# Patient Record
Sex: Female | Born: 1985 | Race: Black or African American | Hispanic: No | Smoking: Never smoker
Health system: Southern US, Community
[De-identification: ages and names within clinical notes are randomized; demographics above are authoritative.]

## PROBLEM LIST (undated history)

## (undated) DIAGNOSIS — Z975 Presence of (intrauterine) contraceptive device: Secondary | ICD-10-CM

## (undated) DIAGNOSIS — G43909 Migraine, unspecified, not intractable, without status migrainosus: Secondary | ICD-10-CM

## (undated) HISTORY — PX: RHINOPLASTY: SUR1284

## (undated) HISTORY — PX: BROW LIFT AND BLEPHAROPLASTY: SHX1271

## (undated) HISTORY — DX: Presence of (intrauterine) contraceptive device: Z97.5

## (undated) HISTORY — PX: ROOT CANAL: SHX2363

---

## 2010-10-22 ENCOUNTER — Inpatient Hospital Stay (HOSPITAL_COMMUNITY)
Admission: AD | Admit: 2010-10-22 | Discharge: 2010-10-24 | Payer: Self-pay | Source: Home / Self Care | Attending: Obstetrics and Gynecology | Admitting: Obstetrics and Gynecology

## 2010-10-22 LAB — RPR: RPR Ser Ql: NONREACTIVE

## 2010-10-22 LAB — CBC
HCT: 37 % (ref 36.0–46.0)
Hemoglobin: 12.4 g/dL (ref 12.0–15.0)
MCH: 28.9 pg (ref 26.0–34.0)
MCHC: 33.5 g/dL (ref 30.0–36.0)
MCV: 86.2 fL (ref 78.0–100.0)
Platelets: 119 10*3/uL — ABNORMAL LOW (ref 150–400)
RBC: 4.29 MIL/uL (ref 3.87–5.11)
RDW: 13.7 % (ref 11.5–15.5)
WBC: 7.6 10*3/uL (ref 4.0–10.5)

## 2010-10-23 LAB — CBC
HCT: 31 % — ABNORMAL LOW (ref 36.0–46.0)
Hemoglobin: 10.3 g/dL — ABNORMAL LOW (ref 12.0–15.0)
MCH: 28.9 pg (ref 26.0–34.0)
MCHC: 33.2 g/dL (ref 30.0–36.0)
MCV: 86.8 fL (ref 78.0–100.0)
Platelets: 112 10*3/uL — ABNORMAL LOW (ref 150–400)
RBC: 3.57 MIL/uL — ABNORMAL LOW (ref 3.87–5.11)
RDW: 13.9 % (ref 11.5–15.5)
WBC: 9.4 10*3/uL (ref 4.0–10.5)

## 2012-11-21 ENCOUNTER — Ambulatory Visit: Payer: BC Managed Care – PPO | Admitting: Obstetrics and Gynecology

## 2012-11-21 ENCOUNTER — Encounter: Payer: Self-pay | Admitting: Obstetrics and Gynecology

## 2012-11-21 VITALS — BP 108/68 | Temp 98.1°F | Ht 66.75 in | Wt 169.0 lb

## 2012-11-21 DIAGNOSIS — Z124 Encounter for screening for malignant neoplasm of cervix: Secondary | ICD-10-CM

## 2012-11-21 DIAGNOSIS — N898 Other specified noninflammatory disorders of vagina: Secondary | ICD-10-CM

## 2012-11-21 DIAGNOSIS — Z01419 Encounter for gynecological examination (general) (routine) without abnormal findings: Secondary | ICD-10-CM

## 2012-11-21 LAB — POCT WET PREP (WET MOUNT)
Whiff Test: NEGATIVE
pH: 5

## 2012-11-21 MED ORDER — FLUCONAZOLE 150 MG PO TABS: 150.0000 mg | ORAL_TABLET | Freq: Once | ORAL | Status: DC

## 2012-11-21 NOTE — Progress Notes (Signed)
Regular Periods: no iud Mammogram: no  Monthly Breast Ex.: yes Exercise: yes  Tetanus < 10 years: yes Seatbelts: yes  NI. Bladder Functn.: yes Abuse at home: no  Daily BM's: yes Stressful Work: no  Healthy Diet: yes Sigmoid-Colonoscopy: no  Calcium: no Medical problems this year: vaginal discharge   LAST PAP:2012  Contraception: iud mirena  Mammogram:  no  PCP: NO  PMH:  NO CHANGE  FMH: NO CHANGE  Last Bone Scan: NO  PT IS SINGLE

## 2012-11-21 NOTE — Progress Notes (Signed)
Subjective:    Courtney Arellano is a 27 y.o. female, G1P0, who presents for an annual exam. The patient reports malodorous vaginal discharge that is heavy.  Menstrual cycle:   LMP: No LMP recorded. Patient is not currently having periods (Reason: IUD).             Review of Systems Pertinent items are noted in HPI. Denies pelvic pain, urinary tract symptoms, vaginitis symptoms, irregular bleeding, menopausal symptoms, change in bowel habits or rectal bleeding   Objective:    BP 108/68  Temp 98.1 F (36.7 C) (Oral)  Ht 5' 6.75" (1.695 m)  Wt 169 lb (76.658 kg)  BMI 26.67 kg/m2    Wt Readings from Last 1 Encounters:  11/21/12 169 lb (76.658 kg)   Body mass index is 26.67 kg/(m^2). General Appearance: Alert, no acute distress HEENT: Grossly normal Neck / Thyroid: Supple, no thyromegaly or cervical adenopathy Lungs: Clear to auscultation bilaterally Back: No CVA tenderness Breast Exam: No masses or nodes.No dimpling, nipple retraction or discharge. Cardiovascular: Regular rate and rhythm.  Gastrointestinal: Soft, non-tender, no masses or organomegaly Pelvic Exam: EGBUS-wnl, vagina-normal rugae, moderate clumpy yellow tinged discharge, cervix- without lesions or tenderness, uterus appears normal size shape and consistency, adnexae-no masses or tenderness Lymphatic Exam: Non-palpable nodes in neck, clavicular,  axillary, or inguinal regions  Skin: no rashes or abnormalities Extremities: no clubbing cyanosis or edema  Neurologic: grossly normal Psychiatric: Alert and oriented  Wet Prep: pH-5.0,  whiff-negative, few  hyphae    Assessment:   Routine GYN Exam Yeast Infection   Plan:  Perineal hygiene  PAP sent  Diflucan 150 mg #1 1 po stat 1 refill  RTO 1 year or prn  Courtney Arellano,ELMIRAPA-C

## 2012-11-22 LAB — PAP IG W/ RFLX HPV ASCU

## 2013-12-12 ENCOUNTER — Emergency Department (HOSPITAL_BASED_OUTPATIENT_CLINIC_OR_DEPARTMENT_OTHER)
Admission: EM | Admit: 2013-12-12 | Discharge: 2013-12-13 | Disposition: A | Discharge status: Home / Self Care | Payer: BC Managed Care – PPO | Attending: Emergency Medicine | Admitting: Emergency Medicine

## 2013-12-12 ENCOUNTER — Ambulatory Visit (INDEPENDENT_AMBULATORY_CARE_PROVIDER_SITE_OTHER): Payer: BC Managed Care – PPO | Admitting: Family Medicine

## 2013-12-12 ENCOUNTER — Encounter (HOSPITAL_BASED_OUTPATIENT_CLINIC_OR_DEPARTMENT_OTHER): Payer: Self-pay | Admitting: Emergency Medicine

## 2013-12-12 VITALS — BP 102/78 | HR 78 | Temp 98.2°F | Resp 16 | Ht 67.5 in | Wt 153.0 lb

## 2013-12-12 DIAGNOSIS — R51 Headache: Secondary | ICD-10-CM

## 2013-12-12 DIAGNOSIS — E860 Dehydration: Secondary | ICD-10-CM

## 2013-12-12 DIAGNOSIS — Z975 Presence of (intrauterine) contraceptive device: Secondary | ICD-10-CM | POA: Insufficient documentation

## 2013-12-12 DIAGNOSIS — G43909 Migraine, unspecified, not intractable, without status migrainosus: Secondary | ICD-10-CM | POA: Insufficient documentation

## 2013-12-12 DIAGNOSIS — R519 Headache, unspecified: Secondary | ICD-10-CM

## 2013-12-12 DIAGNOSIS — Z3202 Encounter for pregnancy test, result negative: Secondary | ICD-10-CM | POA: Insufficient documentation

## 2013-12-12 DIAGNOSIS — R110 Nausea: Secondary | ICD-10-CM

## 2013-12-12 HISTORY — DX: Migraine, unspecified, not intractable, without status migrainosus: G43.909

## 2013-12-12 LAB — PREGNANCY, URINE: Preg Test, Ur: NEGATIVE

## 2013-12-12 MED ORDER — SODIUM CHLORIDE 0.9 % IV SOLN
1000.0000 mL | Freq: Once | INTRAVENOUS | Status: AC
  Administered 2013-12-12: 1000 mL via INTRAVENOUS

## 2013-12-12 MED ORDER — DIPHENHYDRAMINE HCL 50 MG/ML IJ SOLN
25.0000 mg | Freq: Once | INTRAMUSCULAR | Status: AC
  Administered 2013-12-12: 25 mg via INTRAVENOUS
  Filled 2013-12-12: qty 1

## 2013-12-12 MED ORDER — METOCLOPRAMIDE HCL 5 MG/ML IJ SOLN
10.0000 mg | Freq: Once | INTRAMUSCULAR | Status: AC
  Administered 2013-12-12: 10 mg via INTRAVENOUS
  Filled 2013-12-12: qty 2

## 2013-12-12 MED ORDER — SODIUM CHLORIDE 0.9 % IV SOLN
1000.0000 mL | INTRAVENOUS | Status: DC
  Administered 2013-12-12: 1000 mL via INTRAVENOUS

## 2013-12-12 NOTE — ED Notes (Signed)
C/o HA, lightheaded,nausea since 3pm-feels congestion to right side of face

## 2013-12-12 NOTE — Patient Instructions (Signed)
Driving directions to [facility] 3D2D  [phone]  - more info    [address]  [place], [place] [address]     1. Head south on [address] toward [address]      0.5 mi    2. Sharp left onto [address]      0.6 mi    3. Turn left onto the [address] ramp      0.2 mi    4. Merge onto [address]      3.0 mi    5. Continue straight to stay on [address]      0.4 mi    6. Slight left to stay on [address]      1.2 mi    7. Turn right onto [address]      0.1 mi    8. Turn left onto [address]      361 ft    9. Take the 1st left onto [address]  Destination will be on the right    Driving directions to [facility] 3D2D  [phone]  - more info    [address]  [place], [place] [address]     1. Head north on [address] toward [address]      344 ft    2. Turn right onto [address]      0.3 mi    3. Slight left to stay on [address]      1.7 mi    4. Turn left onto [address]  Destination will be on the right     0.6 mi     [facility]  [address]   Driving directions to [address], [place], [place] [address] 3D2D  - more info    [address]  [place], [place] [address]     1. Head south on [address] toward [address]      0.5 mi    2. Sharp left onto [address]      0.6 mi    3. Turn left onto the [address] ramp      0.2 mi    4. Merge onto [address]      3.0 mi    5. Continue straight to stay on [address]      0.4 mi    6. Slight left to stay on [address]  Destination will be on the right     1.0 mi     [address]  [place], [place] [address]   Driving directions to [facility] 3D2D  [phone]  - more info    [address]  [place],  [address]     1. Head south on [address] toward [address]      0.5 mi    2. Sharp left onto [address]      0.6 mi    3. Turn left onto the [address] ramp      0.2 mi    4. Merge onto  [address]      3.0 mi    5. Slight right toward [address] (signs for [address])      0.2 mi    6. Take the ramp to [address]      338 ft    7. Turn left onto [address]      0.3 mi    8. Turn left onto [address]  Destination will be on the right     0.2 mi     [facility]  [address]   Driving directions to [facility] 3D2D  - more info    [address]  [place], [place] [address]     1. Head south on [address] toward [address]      0.7 mi    2. Turn left onto [address]      0.4 mi    3. Take the 3rd right onto [address]      1.1 mi    4. Take the [address] ramp to [place]      0.2 mi    5. Merge onto [address]      3.7 mi    6. Take [address] toward [place]      0.3 mi    7. Keep left at the fork, follow signs for [address]      381 ft    8. Keep left at the fork, follow signs for [address]      302 ft    9. Turn left onto [address]      2.6 mi    10. Turn right onto [address]  Destination will be on the left     0.2 mi     [facility]    

## 2013-12-12 NOTE — Progress Notes (Signed)
 Chief Complaint:  Chief Complaint  Patient presents with  . Migraine    worse one ever-came on suddenly this afternoon-nausea, palpitations, dizziness    HPI: Courtney Arellano is a 28 y.o. female who is here for the worse HA of her life, she has pain behind her right eye, she is dizzy. She has had nausea, pain behind her right eye, no emesis, she has had a runny right nostril, feels right nostril is congested.Denies facial pain like URI sxs or sinus sxs.  She has had no blurred visio, however she,has had postural dizziness and racing heart beat. Feels she has a fast heart rate when she stands or sits up. This HA  is 7/10 pain, she is worried there is something that is wrong and it is not just a headache since it feels so different than normal. Deneis numbness or wekaness or tingling or CP. She has not been able to eat or drink, has slept since 5 pm but HA is still present.  She has noise and light sensitivity  She has had a hsitory of occipital HA not ever treated with meds, she has had chiropractic work done. This has helped she used to get them weekly but was now getting treatments monthly then even further out. No prior efficacy with meds.Has not tried anything for this because she doesnot know what to try.   Past Medical History  Diagnosis Date  . IUD (intrauterine device) in place    No past surgical history on file. History   Social History  . Marital Status: Single    Spouse Name: N/A    Number of Children: N/A  . Years of Education: N/A   Social History Main Topics  . Smoking status: Never Smoker   . Smokeless tobacco: Never Used  . Alcohol Use: Yes     Comment: OCCASIONAL  . Drug Use: No  . Sexual Activity: Yes    Birth Control/ Protection: IUD     Comment: mirena   Other Topics Concern  . None   Social History Narrative  . None   Family History  Problem Relation Age of Onset  . Kidney failure Paternal Grandmother   . Hypertension Father   .  Diabetes Mother    No Known Allergies Prior to Admission medications   Medication Sig Start Date End Date Taking? Authorizing Provider  levonorgestrel (MIRENA) 20 MCG/24HR IUD 1 each by Intrauterine route once.    Historical Provider, MD     ROS: The patient denies fevers, chills, night sweats, unintentional weight loss, chest pain,  wheezing, dyspnea on exertion, vomiting, abdominal pain, dysuria, hematuria, melena, numbness, weakness, or tingling.   All other systems have been reviewed and were otherwise negative with the exception of those mentioned in the HPI and as above.    PHYSICAL EXAM: Filed Vitals:   12/12/13 2035  BP: 102/78  Pulse: 78  Temp: 98.2 F (36.8 C)  Resp: 16   Filed Vitals:   12/12/13 2035  Height: 5' 7.5" (1.715 m)  Weight: 153 lb (69.4 kg)   Body mass index is 23.6 kg/(m^2).  General: Alert, mild distress HEENT:  Normocephalic, atraumatic, oropharynx patent. EOMI, PERRLA, fundoscopic exam nl Cardiovascular:  Regular rate and rhythm, no rubs murmurs or gallops.  No Carotid bruits, radial pulse intact. No pedal edema.  Respiratory: Clear to auscultation bilaterally.  No wheezes, rales, or rhonchi.  No cyanosis, no use of accessory musculature GI: No organomegaly, abdomen is soft and  non-tender, positive bowel sounds.  No masses. Skin: No rashes. Neurologic: Facial musculature symmetric. CN 2-12 grossly normal, cerebellar exam nl Psychiatric: Patient is appropriate throughout our interaction. Lymphatic: No cervical lymphadenopathy Musculoskeletal: Gait intact.   LABS: Results for orders placed in visit on 11/21/12  POCT WET PREP (WET [place])      Result Value Ref Range   Source Wet Prep POC       WBC, Wet Prep HPF POC       Bacteria Wet Prep HPF POC       BACTERIA WET PREP MORPHOLOGY POC       Clue Cells Wet Prep HPF POC       CLUE CELLS WET PREP WHIFF POC       Yeast Wet Prep HPF POC Few     KOH Wet Prep POC       Trichomonas Wet Prep HPF POC        pH 5.0     Whiff Test negative    PAP IG W/ RFLX HPV ASCU      Result Value Ref Range   Specimen adequacy:       FINAL DIAGNOSIS:       COMMENTS:       Cytotechnologist:         EKG/XRAY:   Primary read interpreted [facility] Con[facility]   ASSESSMENT/PLAN: Encounter Diagnoses  Name Primary?  . Headache   . Worst headache of life Yes  . Nausea alone   . Dehydration     Send to ER for further evaluation since the worse HA of her life, feels different then others she has had, she has tachycardia standing up 120-140s and also BP is slightly hypotensive for her Patient is concerned because HAs are worse and wants to know if she has something more than just HA She needs hydration and IV or IM pain meds, we are limited currently due to inclement weather, additionally we only have nubain and no other pain meds After d/w patient pros and cons of what we can and cannot do here in urgent care and what is available at ER, she has decided to go to ER. I have called [facility] and was informed the diff ER options and wait time.  Gross sideeffects, risk and benefits, and alternatives of medications d/w patient. Patient is aware that all medications have potential sideeffects and we are unable to predict every sideeffect or drug-drug interaction that may occur.  Hamilton CapriLE,  PHUONG, DO 12/12/2013 9:12 PM

## 2013-12-12 NOTE — ED Provider Notes (Signed)
CSN: [id number]     Arrival date & time 12/12/13  2130 History   First MD Initiated Contact with Patient 12/12/13 2302     Chief Complaint  Patient presents with  . Headache     (Consider location/radiation/quality/duration/timing/severity/associated sxs/prior Treatment) Patient is a 28 y.o. female presenting with headaches. The history is provided by the patient.  Headache She had onset about 3 PM of a right-sided headache. Headache is burning in nature and she rates the pain at 7/10. It is worse with exposure to light and is worse with movement. There is associated nausea but no vomiting. She denies any visual change. There is no phonophobia. Headache is somewhat similar to migraines she has had in the past. She has not taken any medication to try and help the headache. Nothing seems to make it feel better. She went to urgent care and was referred here. She denies fever or chills. Denies weakness, numbness, tingling.   Past Medical History  Diagnosis Date  . IUD (intrauterine device) in place   . Migraine    History reviewed. No pertinent past surgical history. Family History  Problem Relation Age of Onset  . Kidney failure Paternal Grandmother   . Hypertension Father   . Diabetes Mother    History  Substance Use Topics  . Smoking status: Never Smoker   . Smokeless tobacco: Never Used  . Alcohol Use: No   OB History   Grav Para Term Preterm Abortions TAB SAB Ect Mult Living   1         1     Review of Systems  Neurological: Positive for headaches.  All other systems reviewed and are negative.      Allergies  Review of patient's allergies indicates no known allergies.  Home Medications   Current Outpatient Rx  Name  Route  Sig  Dispense  Refill  . levonorgestrel (MIRENA) 20 MCG/24HR IUD   Intrauterine   1 each by Intrauterine route once.          BP 117/70  Pulse 85  Temp(Src) 98.7 F (37.1 C) (Oral)  Resp 16  Ht 5\' 5"  (1.651 m)  Wt 153 lb (69.4 kg)   BMI 25.46 kg/m2  SpO2 98%  LMP 12/04/2013 Physical Exam  Nursing note and vitals reviewed.  28 year old female, resting comfortably and in no acute distress. Vital signs are  normal . Oxygen saturation is 98%, which is normal. Head is normocephalic and atraumatic. PERRLA, EOMI. Oropharynx is clear. fundi show no hemorrhage, exudate, or papilledema.  Neck is nontender and supple without adenopathy or JVD. Back is nontender and there is no CVA tenderness. Lungs are clear without rales, wheezes, or rhonchi. Chest is nontender. Heart has regular rate and rhythm without murmur. Abdomen is soft, flat, nontender without masses or hepatosplenomegaly and peristalsis is normoactive. Extremities have no cyanosis or edema, full range of motion is present. Skin is warm and dry without rash. Neurologic: Mental status is normal, cranial nerves are intact, there are no motor or sensory deficits.  ED Course  Procedures (including critical care time)  MDM   Final diagnoses:  Migraine headache    Headache which seems most like a migraine headache. She will be given a migraine cocktail and reassessed. Old records are reviewed and only relevant visit is the urgent care visit and just prior to coming here.   She feels much better after IV fluids, IV metoclopramide, and IV diphenhydramine. She is discharged with a prescription  for metoclopramide.  Dione Boozeavid Viola Placeres, MD 12/13/13 [id number]

## 2013-12-13 MED ORDER — METOCLOPRAMIDE HCL 10 MG PO TABS: 10.0000 mg | ORAL_TABLET | Freq: Four times a day (QID) | ORAL | Status: DC | PRN

## 2013-12-13 NOTE — Discharge Instructions (Signed)
Migraine Headache °A migraine headache is an intense, throbbing pain on one or both sides of your head. A migraine can last for 30 minutes to several hours. °CAUSES  °The exact cause of a migraine headache is not always known. However, a migraine may be caused when nerves in the brain become irritated and release chemicals that cause inflammation. This causes pain. °Certain things may also trigger migraines, such as: °· Alcohol. °· Smoking. °· Stress. °· Menstruation. °· Aged cheeses. °· Foods or drinks that contain nitrates, glutamate, aspartame, or tyramine. °· Lack of sleep. °· Chocolate. °· Caffeine. °· Hunger. °· Physical exertion. °· Fatigue. °· Medicines used to treat chest pain (nitroglycerine), birth control pills, estrogen, and some blood pressure medicines. °SIGNS AND SYMPTOMS °· Pain on one or both sides of your head. °· Pulsating or throbbing pain. °· Severe pain that prevents daily activities. °· Pain that is aggravated by any physical activity. °· Nausea, vomiting, or both. °· Dizziness. °· Pain with exposure to bright lights, loud noises, or activity. °· General sensitivity to bright lights, loud noises, or smells. °Before you get a migraine, you may get warning signs that a migraine is coming (aura). An aura may include: °· Seeing flashing lights. °· Seeing bright spots, halos, or zig-zag lines. °· Having tunnel vision or blurred vision. °· Having feelings of numbness or tingling. °· Having trouble talking. °· Having muscle weakness. °DIAGNOSIS  °A migraine headache is often diagnosed based on: °· Symptoms. °· Physical exam. °· A CT scan or MRI of your head. These imaging tests cannot diagnose migraines, but they can help rule out other causes of headaches. °TREATMENT °Medicines may be given for pain and nausea. Medicines can also be given to help prevent recurrent migraines.  °HOME CARE INSTRUCTIONS °· Only take over-the-counter or prescription medicines for pain or discomfort as directed by your  health care provider. The use of long-term narcotics is not recommended. °· Lie down in a dark, quiet room when you have a migraine. °· Keep a journal to find out what may trigger your migraine headaches. For example, write down: °· What you eat and drink. °· How much sleep you get. °· Any change to your diet or medicines. °· Limit alcohol consumption. °· Quit smoking if you smoke. °· Get 7 9 hours of sleep, or as recommended by your health care provider. °· Limit stress. °· Keep lights dim if bright lights bother you and make your migraines worse. °SEEK IMMEDIATE MEDICAL CARE IF:  °· Your migraine becomes severe. °· You have a fever. °· You have a stiff neck. °· You have vision loss. °· You have muscular weakness or loss of muscle control. °· You start losing your balance or have trouble walking. °· You feel faint or pass out. °· You have severe symptoms that are different from your first symptoms. °MAKE SURE YOU:  °· Understand these instructions. °· Will watch your condition. °· Will get help right away if you are not doing well or get worse. °Document Released: 10/04/2005 Document Revised: 07/25/2013 Document Reviewed: 06/11/2013 °ExitCare® Patient Information ©2014 [employer]. ° °Metoclopramide tablets °What is this medicine? °METOCLOPRAMIDE (met oh kloe PRA mide) is used to treat the symptoms of gastroesophageal reflux disease (GERD) like heartburn. It is also used to treat people with slow emptying of the stomach and intestinal tract. °This medicine may be used for other purposes; ask your health care provider or pharmacist if you have questions. °COMMON BRAND NAME(S): Reglan °What should I   tell my health care provider before I take this medicine? °They need to know if you have any of these conditions: °-breast cancer °-depression °-diabetes °-heart failure °-high blood pressure °-kidney disease °-liver disease °-Parkinson's disease or a movement disorder °-pheochromocytoma °-seizures °-stomach  obstruction, bleeding, or perforation °-an unusual or allergic reaction to metoclopramide, procainamide, sulfites, other medicines, foods, dyes, or preservatives °-pregnant or trying to get pregnant °-breast-feeding °How should I use this medicine? °Take this medicine by mouth with a glass of water. Follow the directions on the prescription label. Take this medicine on an empty stomach, about 30 minutes before eating. Take your doses at regular intervals. Do not take your medicine more often than directed. Do not stop taking except on the advice of your doctor or health care professional. °A special MedGuide will be given to you by the pharmacist with each prescription and refill. Be sure to read this information carefully each time. °Talk to your pediatrician regarding the use of this medicine in children. Special care may be needed. °Overdosage: If you think you have taken too much of this medicine contact a poison control center or emergency room at once. °NOTE: This medicine is only for you. Do not share this medicine with others. °What if I miss a dose? °If you miss a dose, take it as soon as you can. If it is almost time for your next dose, take only that dose. Do not take double or extra doses. °What may interact with this medicine? °-acetaminophen °-cyclosporine °-digoxin °-medicines for blood pressure °-medicines for diabetes, including insulin °-medicines for hay fever and other allergies °-medicines for depression, especially an Monoamine Oxidase Inhibitor (MAOI) °-medicines for Parkinson's disease, like levodopa °-medicines for sleep or for pain °-tetracycline °This list may not describe all possible interactions. Give your health care provider a list of all the medicines, herbs, non-prescription drugs, or dietary supplements you use. Also tell them if you smoke, drink alcohol, or use illegal drugs. Some items may interact with your medicine. °What should I watch for while using this medicine? °It may  take a few weeks for your stomach condition to start to get better. However, do not take this medicine for longer than 12 weeks. The longer you take this medicine, and the more you take it, the greater your chances are of developing serious side effects. If you are an elderly patient, a female patient, or you have diabetes, you may be at an increased risk for side effects from this medicine. Contact your doctor immediately if you start having movements you cannot control such as lip smacking, rapid movements of the tongue, involuntary or uncontrollable movements of the eyes, head, arms and legs, or muscle twitches and spasms. °Patients and their families should watch out for worsening depression or thoughts of suicide. Also watch out for any sudden or severe changes in feelings such as feeling anxious, agitated, panicky, irritable, hostile, aggressive, impulsive, severely restless, overly excited and hyperactive, or not being able to sleep. If this happens, especially at the beginning of treatment or after a change in dose, call your doctor. °Do not treat yourself for high fever. Ask your doctor or health care professional for advice. °You may get drowsy or dizzy. Do not drive, use machinery, or do anything that needs mental alertness until you know how this drug affects you. Do not stand or sit up quickly, especially if you are an older patient. This reduces the risk of dizzy or fainting spells. Alcohol can make you more   drowsy and dizzy. Avoid alcoholic drinks. What side effects may I notice from receiving this medicine? Side effects that you should report to your doctor or health care professional as soon as possible: -allergic reactions like skin rash, itching or hives, swelling of the face, lips, or tongue -abnormal production of milk in females -breast enlargement in both males and females -change in the way you walk -difficulty moving, speaking or swallowing -drooling, lip smacking, or rapid movements  of the tongue -excessive sweating -fever -involuntary or uncontrollable movements of the eyes, head, arms and legs -irregular heartbeat or palpitations -muscle twitches and spasms -unusually weak or tired Side effects that usually do not require medical attention (report to your doctor or health care professional if they continue or are bothersome): -change in sex drive or performance -depressed mood -diarrhea -difficulty sleeping -headache -menstrual changes -restless or nervous This list may not describe all possible side effects. Call your doctor for medical advice about side effects. You may report side effects to FDA at 1-800-FDA-1088. Where should I keep my medicine? Keep out of the reach of children. Store at room temperature between 20 and 25 degrees C (68 and 77 degrees F). Protect from light. Keep container tightly closed. Throw away any unused medicine after the expiration date. NOTE: This sheet is a summary. It may not cover all possible information. If you have questions about this medicine, talk to your doctor, pharmacist, or health care provider.  2014, [employer] Standard. (2012-02-01 13:04:38)

## 2013-12-13 NOTE — ED Notes (Signed)
MD at bedside. 

## 2014-08-19 ENCOUNTER — Encounter (HOSPITAL_BASED_OUTPATIENT_CLINIC_OR_DEPARTMENT_OTHER): Payer: Self-pay | Admitting: Emergency Medicine

## 2014-08-27 DIAGNOSIS — Z975 Presence of (intrauterine) contraceptive device: Secondary | ICD-10-CM | POA: Insufficient documentation

## 2014-11-12 ENCOUNTER — Ambulatory Visit (INDEPENDENT_AMBULATORY_CARE_PROVIDER_SITE_OTHER): Payer: BLUE CROSS/BLUE SHIELD

## 2014-11-12 ENCOUNTER — Ambulatory Visit (INDEPENDENT_AMBULATORY_CARE_PROVIDER_SITE_OTHER): Payer: BLUE CROSS/BLUE SHIELD | Admitting: Internal Medicine

## 2014-11-12 VITALS — BP 106/76 | HR 79 | Temp 98.2°F | Resp 16 | Ht 67.5 in | Wt 156.0 lb

## 2014-11-12 DIAGNOSIS — R079 Chest pain, unspecified: Secondary | ICD-10-CM

## 2014-11-12 MED ORDER — MELOXICAM 15 MG PO TABS: 15.0000 mg | ORAL_TABLET | Freq: Every day | ORAL | Status: DC

## 2014-11-12 MED ORDER — HYDROCODONE-ACETAMINOPHEN 5-325 MG PO TABS: ORAL_TABLET | ORAL | Status: DC

## 2014-11-12 MED ORDER — CYCLOBENZAPRINE HCL 10 MG PO TABS: 10.0000 mg | ORAL_TABLET | Freq: Every day | ORAL | Status: DC

## 2014-11-12 NOTE — Progress Notes (Signed)
   Subjective:    Patient ID: Courtney CapersStephanie Saint-Phard, female    DOB: December 08, 1985, 29 y.o.   MRN: [id number]  HPI  Chief Complaint  Patient presents with  . Chest Pain    left side since saturday    she actually started with mild left sided chest pain on Friday, 4 days ago, on awakening. No known injury. No cough or upper respiratory infection. No fever. No shortness of breath. The pain has waxed and waned but became severe enough on Sunday to make it difficult for her to sleep. Also has trouble moving, getting in and out of the car, coughing, deep breathing. Very athletic with intensive cardio which she did the day before this pain started.   Survived one childbirth without evidence of clotting disorder No swelling of her extremities No family history of clotting disorders IUD  Review of Systems No fever chills or night sweats  No recent travel or exposures to illness  No palpitations or tachycardia is noted  No abdominal pain nausea vomiting dysuria or frequency     Objective:   Physical Exam BP 106/76 mmHg  Pulse 79  Temp(Src) 98.2 F (36.8 C) (Oral)  Resp 16  Ht 5' 7.5" (1.715 m)  Wt 156 lb (70.761 kg)  BMI 24.06 kg/m2  SpO2 100% HEENT clear Heart regular without murmur Lungs are clear to auscultation although deep breathing produces left-sided chest pain Abdomen benign She is tender along the left posterior rib margin into the axilla without swelling or defect   [facility] reading (PRIMARY) by  Dr. Merla Richesoolittle= chest x-rays clear       Assessment & Plan:  Problem #1 chest wall pain probably secondary to muscle injury  Meds ordered this encounter  Medications  . meloxicam (MOBIC) 15 MG tablet    Sig: Take 1 tablet (15 mg total) by mouth daily.    Dispense:  30 tablet    Refill:  0  . cyclobenzaprine (FLEXERIL) 10 MG tablet    Sig: Take 1 tablet (10 mg total) by mouth at bedtime.    Dispense:  10 tablet    Refill:  0  . HYDROcodone-acetaminophen (NORCO/VICODIN)  5-325 MG per tablet    Sig: 1-2 at hs if can't sleep due to pain    Dispense:  6 tablet    Refill:  0   Heat Stretch gently--adv to full activ F/u sat if not improving

## 2017-05-17 ENCOUNTER — Encounter (HOSPITAL_COMMUNITY): Payer: Self-pay | Admitting: Obstetrics and Gynecology

## 2017-05-17 DIAGNOSIS — G43909 Migraine, unspecified, not intractable, without status migrainosus: Secondary | ICD-10-CM | POA: Insufficient documentation

## 2019-05-17 ENCOUNTER — Encounter (HOSPITAL_COMMUNITY): Payer: Self-pay | Admitting: Emergency Medicine

## 2019-05-17 ENCOUNTER — Ambulatory Visit (HOSPITAL_COMMUNITY)
Admission: EM | Admit: 2019-05-17 | Discharge: 2019-05-17 | Disposition: A | Discharge status: Home / Self Care | Payer: 59 | Attending: Family Medicine | Admitting: Family Medicine

## 2019-05-17 ENCOUNTER — Other Ambulatory Visit: Payer: Self-pay

## 2019-05-17 DIAGNOSIS — Z202 Contact with and (suspected) exposure to infections with a predominantly sexual mode of transmission: Secondary | ICD-10-CM

## 2019-05-17 DIAGNOSIS — Z113 Encounter for screening for infections with a predominantly sexual mode of transmission: Secondary | ICD-10-CM | POA: Diagnosis present

## 2019-05-17 NOTE — ED Triage Notes (Signed)
Pt was sent a message from a former partner from 2 months ago that stated they were positive for an STD but did not specify what.  Pt states she has not had any symptoms.  She does have some d/c but she states she typically has a little of that and has about a once a year issue with BV.

## 2019-05-17 NOTE — ED Provider Notes (Signed)
[facility]    CSN: [id number] Arrival date & time: 05/17/19  1224     History   Chief Complaint Chief Complaint  Patient presents with  . Exposure to STD    HPI Zamya Culhane is a 33 y.o. female.   Patient is a 33 year old female that presents today for STD screening.  Reporting that she was sent a message from a former partner from a few months back that they were positive for an STD.  There is no specification on what STD it was.  She denies any current symptoms.  She does have soap vaginal discharge but nothing abnormal.  Denies any associated abdominal pain, back pain, fevers, dysuria, hematuria, urinary frequency.  No LMP recorded. (Menstrual status: IUD).  ROS per HPI      Past Medical History:  Diagnosis Date  . IUD (intrauterine device) in place   . Migraine     There are no active problems to display for this patient.   History reviewed. No pertinent surgical history.  OB History    Gravida  1   Para      Term      Preterm      AB      Living  1     SAB      TAB      Ectopic      Multiple      Live Births               Home Medications    Prior to Admission medications   Medication Sig Start Date End Date Taking? Authorizing Provider  levonorgestrel (MIRENA) 20 MCG/24HR IUD 1 each by Intrauterine route once.    [provider]  metoCLOPramide (REGLAN) 10 MG tablet Take 1 tablet (10 mg total) by mouth every 6 (six) hours as needed for nausea (or headache). Patient not taking: Reported on 11/12/2014 5/88/50 2/77/41  Delora Fuel, MD    Family History Family History  Problem Relation Age of Onset  . Kidney failure Paternal Grandmother   . Hypertension Father   . Diabetes Mother   . Hypertension Mother     Social History Social History   Tobacco Use  . Smoking status: Never Smoker  . Smokeless tobacco: Never Used  Substance Use Topics  . Alcohol use: Yes  . Drug use: No     Allergies    Patient has no known allergies.   Review of Systems Review of Systems   Physical Exam Triage Vital Signs ED Triage Vitals  Enc Vitals Group     BP 05/17/19 1248 104/74     Pulse Rate 05/17/19 1248 61     Resp 05/17/19 1248 12     Temp 05/17/19 1248 98.2 F (36.8 C)     Temp Source 05/17/19 1248 Oral     SpO2 05/17/19 1248 100 %     Weight --      Height --      Head Circumference --      Peak Flow --      Pain Score 05/17/19 1245 0     Pain Loc --      Pain Edu? --      Excl. in [place]? --    No data found.  Updated Vital Signs BP 104/74 (BP Location: Left Arm)   Pulse 61   Temp 98.2 F (36.8 C) (Oral)   Resp 12   SpO2 100%   Visual Acuity Right Eye Distance:  Left Eye Distance:   Bilateral Distance:    Right Eye Near:   Left Eye Near:    Bilateral Near:     Physical Exam Vitals signs and nursing note reviewed.  Constitutional:      General: She is not in acute distress.    Appearance: Normal appearance. She is not ill-appearing, toxic-appearing or diaphoretic.  HENT:     Head: Normocephalic.     Nose: Nose normal.     Mouth/Throat:     Pharynx: Oropharynx is clear.  Eyes:     Conjunctiva/sclera: Conjunctivae normal.  Neck:     Musculoskeletal: Normal range of motion.  Pulmonary:     Effort: Pulmonary effort is normal.  Abdominal:     Palpations: Abdomen is soft.     Tenderness: There is no abdominal tenderness.  Musculoskeletal: Normal range of motion.  Skin:    General: Skin is warm and dry.     Findings: No rash.  Neurological:     Mental Status: She is alert.  Psychiatric:        Mood and Affect: Mood normal.      UC Treatments / Results  Labs (all labs ordered are listed, but only abnormal results are displayed) Labs Reviewed  HIV ANTIBODY (ROUTINE TESTING W REFLEX)  RPR  CERVICOVAGINAL ANCILLARY ONLY    EKG   Radiology No results found.  Procedures Procedures (including critical care time)  Medications Ordered in UC  Medications - No data to display  Initial Impression / Assessment and Plan / UC Course  I have reviewed the triage vital signs and the nursing notes.  Pertinent labs & imaging results that were available during my care of the patient were reviewed by me and considered in my medical decision making (see chart for details).     Screening for STDs Labs pending and will call with any positive results.  Also recommending checking MyChart. Final Clinical Impressions(s) / UC Diagnoses   Final diagnoses:  Screen for STD (sexually transmitted disease)     Discharge Instructions     We are screening you for STDs. We will call you with any positive results or you can check your MyChart for results.    ED Prescriptions    None     Controlled Substance Prescriptions [place] Controlled Substance Registry consulted? Not Applicable   Janace ArisBast, Iley Deignan A, NP 05/17/19 1421

## 2019-05-17 NOTE — Discharge Instructions (Addendum)
We are screening you for STDs. We will call you with any positive results or you can check your MyChart for results.

## 2019-05-18 LAB — HIV ANTIBODY (ROUTINE TESTING W REFLEX): HIV Screen 4th Generation wRfx: NONREACTIVE

## 2019-05-18 LAB — RPR: RPR Ser Ql: NONREACTIVE

## 2019-05-19 LAB — CERVICOVAGINAL ANCILLARY ONLY
Bacterial vaginitis: NEGATIVE
Candida vaginitis: POSITIVE — AB
Chlamydia: NEGATIVE
Neisseria Gonorrhea: NEGATIVE
Trichomonas: NEGATIVE

## 2019-05-21 ENCOUNTER — Telehealth (HOSPITAL_COMMUNITY): Payer: Self-pay | Admitting: Emergency Medicine

## 2019-05-21 ENCOUNTER — Encounter (HOSPITAL_COMMUNITY): Payer: Self-pay

## 2019-05-21 MED ORDER — FLUCONAZOLE 150 MG PO TABS: 150.0000 mg | ORAL_TABLET | Freq: Once | ORAL | 0 refills | Status: AC

## 2019-05-21 NOTE — Telephone Encounter (Signed)
Test for candida (yeast) was positive.  Prescription for fluconazole 150mg  po now, repeat dose in 3d if needed, #2 no refills, sent to the pharmacy of record.  Recheck or followup with PCP for further evaluation if symptoms are not improving.    Attempted to reach patient. No answer at this time. Voicemail left.   Mychart message sent.

## 2019-05-21 NOTE — Telephone Encounter (Signed)
Pt called back and was given swab results, all questions answered.

## 2019-07-10 ENCOUNTER — Ambulatory Visit: Payer: 59 | Admitting: Registered Nurse

## 2019-07-10 ENCOUNTER — Encounter: Payer: Self-pay | Admitting: Registered Nurse

## 2019-07-10 ENCOUNTER — Other Ambulatory Visit: Payer: Self-pay

## 2019-07-10 VITALS — BP 101/62 | HR 60 | Temp 98.7°F | Resp 16 | Ht 66.0 in | Wt 169.0 lb

## 2019-07-10 DIAGNOSIS — Z23 Encounter for immunization: Secondary | ICD-10-CM

## 2019-07-10 DIAGNOSIS — Z7689 Persons encountering health services in other specified circumstances: Secondary | ICD-10-CM

## 2019-07-10 DIAGNOSIS — G43709 Chronic migraine without aura, not intractable, without status migrainosus: Secondary | ICD-10-CM

## 2019-07-10 MED ORDER — SUMATRIPTAN SUCCINATE 50 MG PO TABS: 50.0000 mg | ORAL_TABLET | ORAL | 4 refills | Status: DC | PRN

## 2019-07-10 NOTE — Progress Notes (Signed)
Established Patient Office Visit  Subjective:  Patient ID: Courtney Arellano, female    DOB: 12-23-1985  Age: 33 y.o. MRN: [id number]  CC:  Chief Complaint  Patient presents with  . Establish Care  . Migraine    pt states she has been having migrains for years and would like to talk about somthing to help with migraine    HPI Courtney Arellano presents for visit to establish care. Also wishes to talk about migraines. She has had them for all of her life, though recently they have increased in frequency and duration, though overall quality remains the same.  She states that the usual onset is similar to a tension headache, which progresses to unilateral pain, phonophobia, photophobia, and fatigue. She notes that at times, they can last as long as a week. She does note that their onset can be related to low blood sugar or stress. She does not menstruate with her Mirena, as such, they are not related to her menses.  Denies changes in vision, weakness, sensory changes, numbness, tingling, LOC, chest pain, or other neurological deficits.   Past Medical History:  Diagnosis Date  . IUD (intrauterine device) in place   . Migraine     History reviewed. No pertinent surgical history.  Family History  Problem Relation Age of Onset  . Kidney failure Paternal Grandmother   . Hypertension Father   . Diabetes Mother   . Hypertension Mother     Social History   Socioeconomic History  . Marital status: Married    Spouse name: Not on file  . Number of children: Not on file  . Years of education: Not on file  . Highest education level: Not on file  Occupational History  . Not on file  Social Needs  . Financial resource strain: Not hard at all  . Food insecurity    Worry: Never true    Inability: Never true  . Transportation needs    Medical: No    Non-medical: No  Tobacco Use  . Smoking status: Never Smoker  . Smokeless tobacco: Never Used  Substance and Sexual Activity  .  Alcohol use: Yes  . Drug use: No  . Sexual activity: Not on file    Comment: mirena  Lifestyle  . Physical activity    Days per week: Not on file    Minutes per session: Not on file  . Stress: Not on file  Relationships  . Social Herbalist on phone: Not on file    Gets together: Not on file    Attends religious service: Not on file    Active member of club or organization: Not on file    Attends meetings of clubs or organizations: Not on file    Relationship status: Not on file  . Intimate partner violence    Fear of current or ex partner: Not on file    Emotionally abused: Not on file    Physically abused: Not on file    Forced sexual activity: Not on file  Other Topics Concern  . Not on file  Social History Narrative  . Not on file    Outpatient Medications Prior to Visit  Medication Sig Dispense Refill  . levonorgestrel (MIRENA) 20 MCG/24HR IUD 1 each by Intrauterine route once.     No facility-administered medications prior to visit.     No Known Allergies  ROS Review of Systems Per hpi    Objective:    Physical Exam  Constitutional: She is oriented to person, place, and time. She appears well-developed and well-nourished. No distress.  Cardiovascular: Normal rate and regular rhythm.  Pulmonary/Chest: Effort normal. No respiratory distress.  Neurological: She is alert and oriented to person, place, and time. No cranial nerve deficit. Coordination and gait normal. GCS eye subscore is 4. GCS verbal subscore is 5. GCS motor subscore is 6.  Skin: Skin is warm and dry. No rash noted. She is not diaphoretic. No erythema. No pallor.  Psychiatric: She has a normal mood and affect. Her behavior is normal. Judgment and thought content normal.  Nursing note and vitals reviewed.   BP 101/62   Pulse 60   Temp 98.7 F (37.1 C) (Oral)   Resp 16   Ht _0  (1.676 m)   Wt 169 lb (76.7 kg)   SpO2 98%   BMI 27.28 kg/m  Wt Readings from Last 3 Encounters:   07/10/19 169 lb (76.7 kg)  11/12/14 156 lb (70.8 kg)  12/12/13 153 lb (69.4 kg)     Health Maintenance Due  Topic Date Due  . TETANUS/TDAP  12/02/2003  . PAP SMEAR-Modifier  11/22/2015    There are no preventive care reminders to display for this patient.  No results found for: TSH Lab Results  Component Value Date   WBC 9.4 10/23/2010   HGB 10.3 REPEATED TO VERIFY DELTA CHECK NOTED (L) 10/23/2010   HCT 31.0 (L) 10/23/2010   MCV 86.8 10/23/2010   PLT (L) 10/23/2010    112 PLATELET COUNT CONFIRMED BY SMEAR REPEATED TO VERIFY   No results found for: NA, K, CHLORIDE, CO2, GLUCOSE, BUN, CREATININE, BILITOT, ALKPHOS, AST, ALT, PROT, ALBUMIN, CALCIUM, ANIONGAP, EGFR, GFR No results found for: CHOL No results found for: HDL No results found for: LDLCALC No results found for: TRIG No results found for: CHOLHDL No results found for: HGBA1C    Assessment & Plan:   Problem List Items Addressed This Visit      Cardiovascular and Mediastinum   Chronic migraine without aura without status migrainosus, not intractable   Relevant Medications   SUMAtriptan (IMITREX) 50 MG tablet    Other Visit Diagnoses    Encounter to establish care    -  Primary   Need for influenza vaccination       Relevant Orders   Flu Vaccine QUAD 36+ mos IM (Completed)      Meds ordered this encounter  Medications  . SUMAtriptan (IMITREX) 50 MG tablet    Sig: Take 1 tablet (50 mg total) by mouth every 2 (two) hours as needed for migraine. May repeat in 2 hours if headache persists or recurs.    Dispense:  12 tablet    Refill:  4    Order Specific Question:   Supervising Provider    Answer:   Forrest Moron [id number]    Follow-up: Return in about 4 weeks (around 08/07/2019) for med check.   PLAN  Start sumatriptan, take when headache onsets, can take again 2 hours later, then once daily until headache resolves  Discussed taking this with an NSAID, caffeine, water, and food. Discussed all  nonpharm, which she acknowledges that she could improve - largely eating more regularly. She does practice yoga regularly.   Flu shot given today  I spent 25 minutes with this patient, more than 50% of which was spent educating/counseling.  Patient encouraged to call clinic with any questions, comments, or concerns.   Maximiano Coss, NP

## 2019-07-10 NOTE — Patient Instructions (Signed)
° ° ° °  If you have lab work done today you will be contacted with your lab results within the next 2 weeks.  If you have not heard from us then please contact us. The fastest way to get your results is to register for My Chart. ° ° °IF you received an x-ray today, you will receive an invoice from [facility]. Please contact [facility] at [phone] with questions or concerns regarding your invoice.  ° °IF you received labwork today, you will receive an invoice from [facility]. Please contact [facility] at [phone] with questions or concerns regarding your invoice.  ° °Our billing staff will not be able to assist you with questions regarding bills from these companies. ° °You will be contacted with the lab results as soon as they are available. The fastest way to get your results is to activate your My Chart account. Instructions are located on the last page of this paperwork. If you have not heard from us regarding the results in 2 weeks, please contact this office. °  ° ° ° °

## 2019-07-18 ENCOUNTER — Other Ambulatory Visit: Payer: Self-pay

## 2019-07-18 DIAGNOSIS — Z20822 Contact with and (suspected) exposure to covid-19: Secondary | ICD-10-CM

## 2019-07-19 LAB — NOVEL CORONAVIRUS, NAA: SARS-CoV-2, NAA: NOT DETECTED

## 2019-08-07 ENCOUNTER — Other Ambulatory Visit: Payer: Self-pay

## 2019-08-07 ENCOUNTER — Encounter: Payer: Self-pay | Admitting: Registered Nurse

## 2019-08-07 ENCOUNTER — Ambulatory Visit: Payer: 59 | Admitting: Registered Nurse

## 2019-08-07 VITALS — BP 108/72 | HR 77 | Temp 99.0°F | Ht 66.93 in | Wt 173.0 lb

## 2019-08-07 DIAGNOSIS — B373 Candidiasis of vulva and vagina: Secondary | ICD-10-CM | POA: Insufficient documentation

## 2019-08-07 DIAGNOSIS — G43709 Chronic migraine without aura, not intractable, without status migrainosus: Secondary | ICD-10-CM | POA: Diagnosis not present

## 2019-08-07 DIAGNOSIS — B3731 Acute candidiasis of vulva and vagina: Secondary | ICD-10-CM | POA: Insufficient documentation

## 2019-08-07 NOTE — Progress Notes (Signed)
 Established Patient Office Visit  Subjective:  Patient ID: Courtney Arellano, female    DOB: 07/26/1986  Age: 33 y.o. MRN: [id number]  CC:  Chief Complaint  Patient presents with  . Migraine    1 month follow-up for medication management, pt states the medication is not helping at this time.     HPI Courtney Arellano presents for ongoing headaches  She presented originally around 1 month prior to today's visit c/o ongoing headaches with migraine like qualities. She started on sumatriptan 50mg PO once at headache onset, once again two days later, and then once daily until headache resolves. She reports that she has used it twice and it did not help either time. Her headaches remain the same in quality and duration.  She does note that she has not been able to further identify triggers to her headaches at this time.  Past Medical History:  Diagnosis Date  . IUD (intrauterine device) in place   . Migraine     History reviewed. No pertinent surgical history.  Family History  Problem Relation Age of Onset  . Kidney failure Paternal Grandmother   . Hypertension Father   . Diabetes Mother   . Hypertension Mother     Social History   Socioeconomic History  . Marital status: Married    Spouse name: Not on file  . Number of children: Not on file  . Years of education: Not on file  . Highest education level: Not on file  Occupational History  . Not on file  Social Needs  . Financial resource strain: Not hard at all  . Food insecurity    Worry: Never true    Inability: Never true  . Transportation needs    Medical: No    Non-medical: No  Tobacco Use  . Smoking status: Never Smoker  . Smokeless tobacco: Never Used  Substance and Sexual Activity  . Alcohol use: Yes  . Drug use: No  . Sexual activity: Not on file    Comment: mirena  Lifestyle  . Physical activity    Days per week: Not on file    Minutes per session: Not on file  . Stress: Not on file  Relationships   . Social connections    Talks on phone: Not on file    Gets together: Not on file    Attends religious service: Not on file    Active member of club or organization: Not on file    Attends meetings of clubs or organizations: Not on file    Relationship status: Not on file  . Intimate partner violence    Fear of current or ex partner: Not on file    Emotionally abused: Not on file    Physically abused: Not on file    Forced sexual activity: Not on file  Other Topics Concern  . Not on file  Social History Narrative  . Not on file    Outpatient Medications Prior to Visit  Medication Sig Dispense Refill  . levonorgestrel (MIRENA) 20 MCG/24HR IUD 1 each by Intrauterine route once.    . SUMAtriptan (IMITREX) 50 MG tablet Take 1 tablet (50 mg total) by mouth every 2 (two) hours as needed for migraine. May repeat in 2 hours if headache persists or recurs. 12 tablet 4   No facility-administered medications prior to visit.     No Known Allergies  ROS Review of Systems  Constitutional: Negative.   HENT: Negative.   Eyes: Positive for photophobia.    Respiratory: Negative.   Cardiovascular: Negative.   Gastrointestinal: Negative.   Endocrine: Negative.   Genitourinary: Negative.   Musculoskeletal: Negative.   Skin: Negative.   Allergic/Immunologic: Negative.   Neurological: Positive for headaches. Negative for dizziness, tremors, seizures, syncope, speech difficulty, weakness, light-headedness and numbness.  Hematological: Negative.   Psychiatric/Behavioral: Negative.   All other systems reviewed and are negative.     Objective:    Physical Exam  Constitutional: She is oriented to person, place, and time. She appears well-developed and well-nourished. No distress.  Cardiovascular: Normal rate and regular rhythm.  Pulmonary/Chest: Effort normal. No respiratory distress.  Neurological: She is alert and oriented to person, place, and time.  Skin: Skin is warm and dry. No rash  noted. She is not diaphoretic. No erythema. No pallor.  Psychiatric: She has a normal mood and affect. Her behavior is normal. Judgment and thought content normal.  Nursing note and vitals reviewed.   BP 108/72   Pulse 77   Temp 99 F (37.2 C) (Oral)   Ht 5' 6.93" (1.7 m)   Wt 173 lb (78.5 kg)   SpO2 (!) 16%   BMI 27.15 kg/m  Wt Readings from Last 3 Encounters:  08/07/19 173 lb (78.5 kg)  07/10/19 169 lb (76.7 kg)  11/12/14 156 lb (70.8 kg)     Health Maintenance Due  Topic Date Due  . TETANUS/TDAP  12/02/2003  . PAP SMEAR-Modifier  11/22/2015    There are no preventive care reminders to display for this patient.  No results found for: TSH Lab Results  Component Value Date   WBC 9.4 10/23/2010   HGB 10.3 REPEATED TO VERIFY DELTA CHECK NOTED (L) 10/23/2010   HCT 31.0 (L) 10/23/2010   MCV 86.8 10/23/2010   PLT (L) 10/23/2010    112 PLATELET COUNT CONFIRMED BY SMEAR REPEATED TO VERIFY   No results found for: NA, K, CHLORIDE, CO2, GLUCOSE, BUN, CREATININE, BILITOT, ALKPHOS, AST, ALT, PROT, ALBUMIN, CALCIUM, ANIONGAP, EGFR, GFR No results found for: CHOL No results found for: HDL No results found for: LDLCALC No results found for: TRIG No results found for: CHOLHDL No results found for: HGBA1C    Assessment & Plan:   Problem List Items Addressed This Visit      Cardiovascular and Mediastinum   Chronic migraine without aura without status migrainosus, not intractable - Primary   Relevant Orders   Ambulatory referral to Neurology      No orders of the defined types were placed in this encounter.   Follow-up: No follow-ups on file.   PLAN  Refer to neuro per pt request for further management of migraine  Suggest pt return to clinic at pt preference for CPE and labs  Patient encouraged to call clinic with any questions, comments, or concerns.   Maximiano Coss, NP

## 2019-08-07 NOTE — Patient Instructions (Signed)
° ° ° °  If you have lab work done today you will be contacted with your lab results within the next 2 weeks.  If you have not heard from us then please contact us. The fastest way to get your results is to register for My Chart. ° ° °IF you received an x-ray today, you will receive an invoice from [facility]. Please contact [facility] at [phone] with questions or concerns regarding your invoice.  ° °IF you received labwork today, you will receive an invoice from [facility]. Please contact [facility] at [phone] with questions or concerns regarding your invoice.  ° °Our billing staff will not be able to assist you with questions regarding bills from these companies. ° °You will be contacted with the lab results as soon as they are available. The fastest way to get your results is to activate your My Chart account. Instructions are located on the last page of this paperwork. If you have not heard from us regarding the results in 2 weeks, please contact this office. °  ° ° ° °

## 2019-08-08 ENCOUNTER — Encounter: Payer: Self-pay | Admitting: Neurology

## 2019-09-04 NOTE — Progress Notes (Deleted)
NEUROLOGY CONSULTATION NOTE  Courtney Arellano MRN: [id number] DOB: 09-21-1986  Referring provider: Janeece Agee, NP Primary care provider: Janeece Agee, NP  Reason for consult:  migraines  HISTORY OF PRESENT ILLNESS: Courtney Arellano is a 33 year old female who presents for migraines.  History supplemented by referring provider note.  Onset:  *** Location:  *** Quality:  *** Intensity:  ***.  *** denies new headache, thunderclap headache or severe headache that wakes *** from sleep. Aura:  *** Premonitory Phase:  *** Postdrome:  *** Associated symptoms:  ***.  *** denies associated unilateral numbness or weakness. Duration:  *** Frequency:  *** Frequency of abortive medication: *** Triggers:  *** Exacerbating factors:  *** Relieving factors:  *** Activity:  ***  Current NSAIDS:  *** Current analgesics:  *** Current triptans:  Sumatriptan 50mg  Current ergotamine:  none Current anti-emetic:  none Current muscle relaxants:  none Current anti-anxiolytic:  none Current sleep aide:  none Current Antihypertensive medications:  none Current Antidepressant medications:  none Current Anticonvulsant medications:  none Current anti-CGRP:  none Current Vitamins/Herbal/Supplements:  none Current Antihistamines/Decongestants:  none Other therapy:  *** Hormone/birth control:  Mirena  Past NSAIDS:  *** Past analgesics:  *** Past abortive triptans:  *** Past abortive ergotamine:  none Past muscle relaxants:  none Past anti-emetic:  none Past antihypertensive medications:  none Past antidepressant medications:  none Past anticonvulsant medications:  none Past anti-CGRP:  none Past vitamins/Herbal/Supplements:  none Past antihistamines/decongestants:  none Other past therapies:  ***  Caffeine:  *** Alcohol:  *** Smoker:  *** Diet:  *** Exercise:  *** Depression:  ***; Anxiety:  *** Other pain:  *** Sleep hygiene:  *** Family history of headache:  ***  PAST  MEDICAL HISTORY: Past Medical History:  Diagnosis Date  . IUD (intrauterine device) in place   . Migraine     PAST SURGICAL HISTORY: No past surgical history on file.  MEDICATIONS: Current Outpatient Medications on File Prior to Visit  Medication Sig Dispense Refill  . levonorgestrel (MIRENA) 20 MCG/24HR IUD 1 each by Intrauterine route once.    . SUMAtriptan (IMITREX) 50 MG tablet Take 1 tablet (50 mg total) by mouth every 2 (two) hours as needed for migraine. May repeat in 2 hours if headache persists or recurs. 12 tablet 4  . [DISCONTINUED] metoCLOPramide (REGLAN) 10 MG tablet Take 1 tablet (10 mg total) by mouth every 6 (six) hours as needed for nausea (or headache). (Patient not taking: Reported on 11/12/2014) 30 tablet 0   No current facility-administered medications on file prior to visit.     ALLERGIES: No Known Allergies  FAMILY HISTORY: Family History  Problem Relation Age of Onset  . Kidney failure Paternal Grandmother   . Hypertension Father   . Diabetes Mother   . Hypertension Mother    SOCIAL HISTORY: Social History   Socioeconomic History  . Marital status: Married    Spouse name: Not on file  . Number of children: Not on file  . Years of education: Not on file  . Highest education level: Not on file  Occupational History  . Not on file  Social Needs  . Financial resource strain: Not hard at all  . Food insecurity    Worry: Never true    Inability: Never true  . Transportation needs    Medical: No    Non-medical: No  Tobacco Use  . Smoking status: Never Smoker  . Smokeless tobacco: Never Used  Substance and Sexual Activity  .  Alcohol use: Yes  . Drug use: No  . Sexual activity: Not on file    Comment: mirena  Lifestyle  . Physical activity    Days per week: Not on file    Minutes per session: Not on file  . Stress: Not on file  Relationships  . Social Herbalist on phone: Not on file    Gets together: Not on file    Attends  religious service: Not on file    Active member of club or organization: Not on file    Attends meetings of clubs or organizations: Not on file    Relationship status: Not on file  . Intimate partner violence    Fear of current or ex partner: Not on file    Emotionally abused: Not on file    Physically abused: Not on file    Forced sexual activity: Not on file  Other Topics Concern  . Not on file  Social History Narrative  . Not on file    REVIEW OF SYSTEMS: Constitutional: No fevers, chills, or sweats, no generalized fatigue, change in appetite Eyes: No visual changes, double vision, eye pain Ear, nose and throat: No hearing loss, ear pain, nasal congestion, sore throat Cardiovascular: No chest pain, palpitations Respiratory:  No shortness of breath at rest or with exertion, wheezes GastrointestinaI: No nausea, vomiting, diarrhea, abdominal pain, fecal incontinence Genitourinary:  No dysuria, urinary retention or frequency Musculoskeletal:  No neck pain, back pain Integumentary: No rash, pruritus, skin lesions Neurological: as above Psychiatric: No depression, insomnia, anxiety Endocrine: No palpitations, fatigue, diaphoresis, mood swings, change in appetite, change in weight, increased thirst Hematologic/Lymphatic:  No purpura, petechiae. Allergic/Immunologic: no itchy/runny eyes, nasal congestion, recent allergic reactions, rashes  PHYSICAL EXAM: *** General: No acute distress.  Patient appears well-groomed.   Head:  Normocephalic/atraumatic Eyes:  fundi examined but not visualized Neck: supple, no paraspinal tenderness, full range of motion Back: No paraspinal tenderness Heart: regular rate and rhythm Lungs: Clear to auscultation bilaterally. Vascular: No carotid bruits. Neurological Exam: Mental status: alert and oriented to person, place, and time, recent and remote memory intact, fund of knowledge intact, attention and concentration intact, speech fluent and not  dysarthric, language intact. Cranial nerves: CN I: not tested CN II: pupils equal, round and reactive to light, visual fields intact CN III, IV, VI:  full range of motion, no nystagmus, no ptosis CN V: facial sensation intact CN VII: upper and lower face symmetric CN VIII: hearing intact CN IX, X: gag intact, uvula midline CN XI: sternocleidomastoid and trapezius muscles intact CN XII: tongue midline Bulk & Tone: normal, no fasciculations. Motor:  5/5 throughout *** Sensation:  Pinprick *** temperature *** and vibration sensation intact.  ***. Deep Tendon Reflexes:  2+ throughout, *** toes downgoing.  *** Finger to nose testing:  Without dysmetria.  *** Heel to shin:  Without dysmetria.  *** Gait:  Normal station and stride.  Able to turn and tandem walk. Romberg ***.  IMPRESSION: ***  PLAN: ***  Thank you for allowing me to take part in the care of this patient.  Metta Clines, DO  CC: Maximiano Coss, NP

## 2019-09-06 ENCOUNTER — Other Ambulatory Visit: Payer: Self-pay

## 2019-09-06 DIAGNOSIS — Z20822 Contact with and (suspected) exposure to covid-19: Secondary | ICD-10-CM

## 2019-09-07 ENCOUNTER — Ambulatory Visit: Payer: 59 | Admitting: Neurology

## 2019-09-07 ENCOUNTER — Other Ambulatory Visit: Payer: Self-pay

## 2019-09-09 LAB — NOVEL CORONAVIRUS, NAA: SARS-CoV-2, NAA: NOT DETECTED

## 2019-10-04 ENCOUNTER — Ambulatory Visit
Admission: EM | Admit: 2019-10-04 | Discharge: 2019-10-04 | Disposition: A | Discharge status: Home / Self Care | Payer: 59 | Attending: Physician Assistant | Admitting: Physician Assistant

## 2019-10-04 ENCOUNTER — Encounter: Payer: Self-pay | Admitting: Emergency Medicine

## 2019-10-04 ENCOUNTER — Other Ambulatory Visit: Payer: Self-pay

## 2019-10-04 DIAGNOSIS — Z20828 Contact with and (suspected) exposure to other viral communicable diseases: Secondary | ICD-10-CM | POA: Diagnosis not present

## 2019-10-04 DIAGNOSIS — G43019 Migraine without aura, intractable, without status migrainosus: Secondary | ICD-10-CM

## 2019-10-04 MED ORDER — METOCLOPRAMIDE HCL 5 MG/ML IJ SOLN
5.0000 mg | Freq: Once | INTRAMUSCULAR | Status: AC
  Administered 2019-10-04: 20:00:00 5 mg via INTRAMUSCULAR

## 2019-10-04 MED ORDER — DEXAMETHASONE SODIUM PHOSPHATE 10 MG/ML IJ SOLN
10.0000 mg | Freq: Once | INTRAMUSCULAR | Status: AC
  Administered 2019-10-04: 10 mg via INTRAMUSCULAR

## 2019-10-04 MED ORDER — KETOROLAC TROMETHAMINE 30 MG/ML IJ SOLN
30.0000 mg | Freq: Once | INTRAMUSCULAR | Status: AC
  Administered 2019-10-04: 30 mg via INTRAMUSCULAR

## 2019-10-04 NOTE — ED Notes (Signed)
Patient able to ambulate independently  

## 2019-10-04 NOTE — ED Provider Notes (Signed)
[facility]    CSN: [id number] Arrival date & time: 10/04/19  2536      History   Chief Complaint Chief Complaint  Patient presents with  . Headache    HPI Courtney Arellano is a 33 y.o. female.   33 year old female comes in for 2-day history of migraine.  States has history of migraine, usually after Advil and Goody powder, symptoms would resolve, but current episode continued, and therefore came in for evaluation.  Pain is right-sided," searing"/aching in sensation.  She has had photophobia without phonophobia.  Nausea without vomiting.  Has had lightheadedness, dizziness, stating she feels this is due to lack of eating.  She still able to ambulate on own without difficulty.  Denies recent head injury, loss of consciousness.  Denies URI symptoms such as cough, congestion, sore throat.  Denies fever, chills, body aches.  Denies shortness of breath, loss of taste or smell.  Denies sick/Covid contact. Works from home. She has a prescription for Imitrex, this has not helped to abort her migraines in the past, and therefore has stopped using.      Past Medical History:  Diagnosis Date  . IUD (intrauterine device) in place   . Migraine     Patient Active Problem List   Diagnosis Date Noted  . Candidiasis of vagina 08/07/2019  . Chronic migraine without aura without status migrainosus, not intractable 07/10/2019  . Migraine 05/17/2017  . IUD contraception 08/27/2014    History reviewed. No pertinent surgical history.  OB History    Gravida  1   Para      Term      Preterm      AB      Living  1     SAB      TAB      Ectopic      Multiple      Live Births               Home Medications    Prior to Admission medications   Medication Sig Start Date End Date Taking? Authorizing Provider  levonorgestrel (MIRENA) 20 MCG/24HR IUD 1 each by Intrauterine route once.    [provider]  SUMAtriptan (IMITREX) 50 MG tablet Take 1 tablet  (50 mg total) by mouth every 2 (two) hours as needed for migraine. May repeat in 2 hours if headache persists or recurs. 07/10/19   Janeece Agee, NP  metoCLOPramide (REGLAN) 10 MG tablet Take 1 tablet (10 mg total) by mouth every 6 (six) hours as needed for nausea (or headache). Patient not taking: Reported on 11/12/2014 12/13/13 05/17/19  Dione Booze, MD   Family History Family History  Problem Relation Age of Onset  . Kidney failure Paternal Grandmother   . Hypertension Father   . Diabetes Mother   . Hypertension Mother    Social History Social History   Tobacco Use  . Smoking status: Never Smoker  . Smokeless tobacco: Never Used  Substance Use Topics  . Alcohol use: Yes  . Drug use: No     Allergies   Patient has no known allergies.   Review of Systems Review of Systems  Reason unable to perform ROS: See HPI as above.     Physical Exam Triage Vital Signs ED Triage Vitals  Enc Vitals Group     BP 10/04/19 1930 121/77     Pulse Rate 10/04/19 1930 (!) 56     Resp 10/04/19 1930 18     Temp  10/04/19 1930 97.8 F (36.6 C)     Temp Source 10/04/19 1930 Temporal     SpO2 10/04/19 1930 98 %     Weight --      Height --      Head Circumference --      Peak Flow --      Pain Score 10/04/19 1932 8     Pain Loc --      Pain Edu? --      Excl. in [place]? --    No data found.  Updated Vital Signs BP 121/77 (BP Location: Left Arm)   Pulse (!) 56   Temp 97.8 F (36.6 C) (Temporal)   Resp 18   SpO2 98%   Physical Exam Constitutional:      General: She is not in acute distress.    Appearance: Normal appearance. She is not ill-appearing, toxic-appearing or diaphoretic.  HENT:     Head: Normocephalic and atraumatic.     Mouth/Throat:     Mouth: Mucous membranes are moist.     Pharynx: Oropharynx is clear. Uvula midline.  Eyes:     Extraocular Movements: Extraocular movements intact.     Conjunctiva/sclera: Conjunctivae normal.     Pupils: Pupils are equal, round,  and reactive to light.  Cardiovascular:     Rate and Rhythm: Normal rate and regular rhythm.     Heart sounds: Normal heart sounds. No murmur. No friction rub. No gallop.   Pulmonary:     Effort: Pulmonary effort is normal. No accessory muscle usage, prolonged expiration, respiratory distress or retractions.     Comments: Lungs clear to auscultation without adventitious lung sounds. Musculoskeletal:     Cervical back: Normal range of motion and neck supple.  Neurological:     General: No focal deficit present.     Mental Status: She is alert and oriented to person, place, and time.     GCS: GCS eye subscore is 4. GCS verbal subscore is 5. GCS motor subscore is 6.     Cranial Nerves: Cranial nerves are intact.     Sensory: Sensation is intact.     Motor: Motor function is intact.     Coordination: Coordination is intact.     Gait: Gait is intact.      UC Treatments / Results  Labs (all labs ordered are listed, but only abnormal results are displayed) Labs Reviewed  NOVEL CORONAVIRUS, NAA    EKG   Radiology No results found.  Procedures Procedures (including critical care time)  Medications Ordered in UC Medications  metoCLOPramide (REGLAN) injection 5 mg (has no administration in time range)  dexamethasone (DECADRON) injection 10 mg (has no administration in time range)  ketorolac (TORADOL) 30 MG/ML injection 30 mg (has no administration in time range)    Initial Impression / Assessment and Plan / UC Course  I have reviewed the triage vital signs and the nursing notes.  Pertinent labs & imaging results that were available during my care of the patient were reviewed by me and considered in my medical decision making (see chart for details).    Discussed lower suspicion for COVID at this time given history of migraines with symptoms consistent with migraine. However, discussed nonresolving headaches can be symptoms of COVID, if continues with headache, may need COVID  testing. Patient has neurology appointment next week, and opted for COVID testing today. COVID testing ordered, quarantine until testing results return. Toradol, reglan, decadron injection in office today. Push fluids. Return  precautions given.   Final Clinical Impressions(s) / UC Diagnoses   Final diagnoses:  Intractable migraine without aura and without status migrainosus   ED Prescriptions    None     PDMP not reviewed this encounter.   Belinda FisherYu, Othello Dickenson V, PA-C 10/04/19 1953

## 2019-10-04 NOTE — ED Triage Notes (Signed)
Pt presents to [facility] for assessment of headache starting last night with a hx of migraines.  States she has taken advil and goody powers alternating without relief.  States prescription for Imitrex given a few months ago, but stopped using it because it wasn't helping.

## 2019-10-04 NOTE — Discharge Instructions (Signed)
Toradol, Reglan, Decadron injection in office today.  As discussed, low suspicion for Covid causing symptoms.  However, given you have appointment next week, we will do COVID testing in case headache does not resolve to rule out COVID. COVID PCR testing ordered. I would like you to quarantine until testing results. You can take over the counter flonase/nasacort to help with nasal congestion/drainage. If experiencing shortness of breath, trouble breathing, go to the emergency department for further evaluation needed. If experiencing worsening headache, nausea/vomiting, worsening weakness/dizziness, passing out, go to the ED for further evaluation needed.

## 2019-10-06 LAB — NOVEL CORONAVIRUS, NAA: SARS-CoV-2, NAA: NOT DETECTED

## 2019-10-09 NOTE — Progress Notes (Signed)
NEUROLOGY CONSULTATION NOTE  Courtney Arellano MRN: [id number] DOB: Mar 07, 1986  Referring provider: Maximiano Coss, NP Primary care provider: Maximiano Coss, NP  Reason for consult:  migraine  HISTORY OF PRESENT ILLNESS: Courtney Arellano is a 33 year old female who presents for migraines.  History supplemented by ED and referring provider notes.  Onset:  High school Location:  Usually in back of head or right frontal region Quality:  pounding Intensity:  Mostly mild-moderate, rarely severe.  She denies new headache, thunderclap headache or severe headache that wakes her from sleep. Aura:  none Premonitory Phase:  none Postdrome:  fatigue Associated symptoms:  Photophobia, nausea, rarely vomiting.  She denies associated phonophobia, visual disturbance, unilateral numbness or weakness. Duration:  Mild to moderate lasts 2-3 days, severe lasted 2 days. Frequency:  One or two in 2019.  Increased frequency in March 2020.  4 migraines over past 30 days (15 headache days), 1 was severe. Frequency of abortive medication: Tylenol, ibuprofen, Excedrin or Goody 2 days a week. Triggers:  Postural, certain smells (cooked meat, formaldehyde), lack of sleep Relieving factors:  Ice, rest Activity:  Severe aggravates  She presented to Urgent Care on 10/04/2019 for intractable migraine which was treated with a headache cocktail.  Rescue:  Ibuprofen or Tylenol, then Excedrin or Goody Current NSAIDS:  ibuprofen Current analgesics:  Excedrin, Goody Current triptans:  Sumatriptan 50mg   (ineffective) Current ergotamine:  none Current anti-emetic:  none Current muscle relaxants:  none Current anti-anxiolytic:  none Current sleep aide:  Benadryl Current Antihypertensive medications:  Unable to take due to baseline low blood pressure. Current Antidepressant medications:  none Current Anticonvulsant medications:  none Current anti-CGRP:  none Current Vitamins/Herbal/Supplements:  none Current  Antihistamines/Decongestants:  Benadryl Other therapy:  none Hormone/birth control:  Mirena  Past NSAIDS:  Meloxicam Past analgesics:  none Past abortive triptans:  none Past abortive ergotamine:  none Past muscle relaxants:  Flexeril Past anti-emetic:  Reglan Past antihypertensive medications:  none Past antidepressant medications:  none Past anticonvulsant medications:  none Past anti-CGRP:  none Past vitamins/Herbal/Supplements:  none Past antihistamines/decongestants:  none Other past therapies:  Chiropractic medicine  Caffeine:  2 cups of coffee daily; Coke Diet:  No water.   Exercise:  routine Depression:  no; Anxiety:  no Other pain:  no Sleep hygiene:  good Family history of headache:  No  PAST MEDICAL HISTORY: Past Medical History:  Diagnosis Date  . IUD (intrauterine device) in place   . Migraine     PAST SURGICAL HISTORY: History reviewed. No pertinent surgical history.  MEDICATIONS: Current Outpatient Medications on File Prior to Visit  Medication Sig Dispense Refill  . levonorgestrel (MIRENA) 20 MCG/24HR IUD 1 each by Intrauterine route once.    . SUMAtriptan (IMITREX) 50 MG tablet Take 1 tablet (50 mg total) by mouth every 2 (two) hours as needed for migraine. May repeat in 2 hours if headache persists or recurs. 12 tablet 4  . [DISCONTINUED] metoCLOPramide (REGLAN) 10 MG tablet Take 1 tablet (10 mg total) by mouth every 6 (six) hours as needed for nausea (or headache). (Patient not taking: Reported on 11/12/2014) 30 tablet 0   No current facility-administered medications on file prior to visit.    ALLERGIES: No Known Allergies  FAMILY HISTORY: Family History  Problem Relation Age of Onset  . Kidney failure Paternal Grandmother   . Hypertension Father   . Diabetes Mother   . Hypertension Mother   .  SOCIAL HISTORY: Social History   Socioeconomic History  .  Marital status: Married    Spouse name: Not on file  . Number of children: Not on  file  . Years of education: Not on file  . Highest education level: Not on file  Occupational History  . 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 Smoking status: Never Smoker  . Smokeless tobacco: Never Used  Substance and Sexual Activity  . Alcohol use: Yes  . Drug use: No  . Sexual activity: Not on file    Comment: mirena  Other Topics Concern  . Not on file  Social History Narrative   Right handed   One story   Drinks caffeine 2 cups daily   Social Determinants of Health   Financial Resource Strain: Low Risk   . Difficulty of Paying Living Expenses: Not hard at all  Food Insecurity: No Food Insecurity  . Worried About Programme researcher, broadcasting/film/video in the Last Year: Never true  . Ran Out of Food in the Last Year: Never true  Transportation Needs: No Transportation Needs  . Lack of Transportation (Medical): No  . Lack of Transportation (Non-Medical): No  Physical Activity:   . Days of Exercise per Week: Not on file  . Minutes of Exercise per Session: Not on file  Stress:   . Feeling of Stress : Not on file  Social Connections:   . Frequency of Communication with Friends and Family: Not on file  . Frequency of Social Gatherings with Friends and Family: Not on file  . Attends Religious Services: Not on file  . Active Member of Clubs or Organizations: Not on file  . Attends Banker Meetings: Not on file  . Marital Status: Not on file  Intimate Partner Violence:   . Fear of Current or Ex-Partner: Not on file  . Emotionally Abused: Not on file  . Physically Abused: Not on file  . Sexually Abused: Not on file    REVIEW OF SYSTEMS: Constitutional: No fevers, chills, or sweats, no generalized fatigue, change in appetite Eyes: No visual changes, double vision, eye pain Ear, nose and throat: No hearing loss, ear pain, nasal congestion, sore throat Cardiovascular: No chest pain, palpitations Respiratory:  No shortness of breath at rest or with exertion,  wheezes GastrointestinaI: No nausea, vomiting, diarrhea, abdominal pain, fecal incontinence Genitourinary:  No dysuria, urinary retention or frequency Musculoskeletal:  No neck pain, back pain Integumentary: No rash, pruritus, skin lesions Neurological: as above Psychiatric: No depression, insomnia, anxiety Endocrine: No palpitations, fatigue, diaphoresis, mood swings, change in appetite, change in weight, increased thirst Hematologic/Lymphatic:  No purpura, petechiae. Allergic/Immunologic: no itchy/runny eyes, nasal congestion, recent allergic reactions, rashes  PHYSICAL EXAM: Blood pressure 96/64, pulse 97, height 5\' 5"  (1.651 m), weight 177 lb (80.3 kg), SpO2 97 %. General: No acute distress.  Patient appears well-groomed.  Head:  Normocephalic/atraumatic Eyes:  fundi examined but not visualized Neck: supple, no paraspinal tenderness, full range of motion Back: No paraspinal tenderness Heart: regular rate and rhythm Lungs: Clear to auscultation bilaterally. Vascular: No carotid bruits. Neurological Exam: Mental status: alert and oriented to person, place, and time, recent and remote memory intact, fund of knowledge intact, attention and concentration intact, speech fluent and not dysarthric, language intact. Cranial nerves: CN I: not tested CN II: pupils equal, round and reactive to light, visual fields intact CN III, IV, VI:  full range of motion, no nystagmus, no ptosis CN V: facial sensation intact CN VII: upper and lower face symmetric CN VIII: hearing intact CN  IX, X: gag intact, uvula midline CN XI: sternocleidomastoid and trapezius muscles intact CN XII: tongue midline Bulk & Tone: normal, no fasciculations. Motor:  5/5 throughout  Sensation: temperature and vibration sensation intact. Deep Tendon Reflexes:  2+ throughout, toes downgoing.  Finger to nose testing:  Without dysmetria.  Heel to shin:  Without dysmetria.  Gait:  Normal station and stride.  Romberg  negative.  IMPRESSION: Chronic migraine without aura, without status migrainosus, not intractable  PLAN: 1.  For preventative management, will start topiramate 25mg  at bedtime for one week, then increase to 50mg  at bedtime.  We can increase dose to 75mg  at bedtime in 5 weeks if needed.  Check baseline BMP 2.  For abortive therapy, rizatriptan 10mg .  Stop OTC analgesics. 3.  Limit use of pain relievers to no more than 2 days out of week to prevent risk of rebound or medication-overuse headache. 4.  Keep headache diary 5.  Exercise, hydration, caffeine cessation, sleep hygiene, monitor for and avoid triggers 6.  Consider:  magnesium citrate 400mg  daily, riboflavin 400mg  daily, and coenzyme Q10 100mg  three times daily 7. Follow up 4 months  45 minutes spent face to face with patient, over 50% spent discussing management.   Thank you for allowing me to take part in the care of this patient.  Shon MilletAdam Braelynn Lupton, DO  CC:  Janeece Ageeichard Morrow, NP

## 2019-10-10 ENCOUNTER — Other Ambulatory Visit: Payer: Self-pay

## 2019-10-10 ENCOUNTER — Ambulatory Visit: Payer: 59 | Admitting: Neurology

## 2019-10-10 ENCOUNTER — Encounter: Payer: Self-pay | Admitting: Neurology

## 2019-10-10 VITALS — BP 96/64 | HR 97 | Ht 65.0 in | Wt 177.0 lb

## 2019-10-10 DIAGNOSIS — G43709 Chronic migraine without aura, not intractable, without status migrainosus: Secondary | ICD-10-CM | POA: Diagnosis not present

## 2019-10-10 MED ORDER — TOPIRAMATE 50 MG PO TABS
ORAL_TABLET | ORAL | 0 refills | Status: DC
Start: 2019-10-10 — End: 2019-11-22

## 2019-10-10 MED ORDER — RIZATRIPTAN BENZOATE 10 MG PO TABS: ORAL_TABLET | ORAL | 3 refills | Status: DC

## 2019-10-10 NOTE — Patient Instructions (Addendum)
Migraine Recommendations: 1.  Start topiramate 50mg .  Take 1/2 tablet at bedtime for one week, then increase to 1 tablet at bedtime.  Contact me in 4 to 5 weeks with update and we can adjust dose if needed. 2.  Take rizatriptan 10mg  at earliest onset of headache.  May repeat dose once in 2 hours if needed.  Do not exceed two tablets in 24 hours.  Stop over the counter meds. 3.  Limit use of pain relievers to no more than 2 days out of the week.  These medications include acetaminophen, ibuprofen, triptans and narcotics.  This will help reduce risk of rebound headaches. 4.  Be aware of common food triggers such as processed sweets, processed foods with nitrites (such as deli meat, hot dogs, sausages), foods with MSG, alcohol (such as wine), chocolate, certain cheeses, certain fruits (dried fruits, bananas, pineapple), vinegar, diet soda. 4.  Avoid caffeine 5.  Routine exercise 6.  Proper sleep hygiene 7.  Stay adequately hydrated with water 8.  Keep a headache diary. 9.  Maintain proper stress management. 10.  Do not skip meals. 11.  Consider supplements:  Magnesium citrate 400mg  to 600mg  daily, riboflavin 400mg , Coenzyme Q 10 100mg  three times daily  Your provider has requested that you have labwork completed today. Please go to [facility] ([id number]) on the second floor of this building before leaving the office today. You do not need to check in. If you are not called within 15 minutes please check with the front desk.

## 2019-11-22 ENCOUNTER — Other Ambulatory Visit: Payer: Self-pay | Admitting: Neurology

## 2020-02-14 NOTE — Progress Notes (Deleted)
NEUROLOGY FOLLOW UP OFFICE NOTE  Courtney Arellano [id number]  HISTORY OF PRESENT ILLNESS: Courtney Arellano is a 34 year old female who follows up for migraines.  UPDATE: Started on topiramate in December. Intensity:  *** Duration:  *** Frequency:  *** Frequency of abortive medication: *** Current NSAIDS:  none Current analgesics:  none Current triptans:  rizatriptan 10mg  Current ergotamine:  none Current anti-emetic:  none Current muscle relaxants:  none Current anti-anxiolytic:  none Current sleep aide:  Benadryl Current Antihypertensive medications:  Unable to take due to baseline low blood pressure. Current Antidepressant medications:  none Current Anticonvulsant medications:  topiramate 50mg  at bedtime Current anti-CGRP:  none Current Vitamins/Herbal/Supplements:  none Current Antihistamines/Decongestants:  Benadryl Other therapy:  none Hormone/birth control:  Mirena  Caffeine:  2 cups of coffee daily; Coke Diet:  No water.   Exercise:  routine Depression:  no; Anxiety:  no Other pain:  no Sleep hygiene:  good  HISTORY: Onset:  High school Location:  Usually in back of head or right frontal region Quality:  pounding Initial intensity:  Mostly mild-moderate, rarely severe.  She denies new headache, thunderclap headache or severe headache that wakes her from sleep. Aura:  none Premonitory Phase:  none Postdrome:  fatigue Associated symptoms:  Photophobia, nausea, rarely vomiting.  She denies associated phonophobia, visual disturbance, unilateral numbness or weakness. Initial duration:  Mild to moderate lasts 2-3 days, severe lasted 2 days. Initial frequency:  One or two in 2019.  Increased frequency in March 2020.  4 migraines over past 30 days (15 headache days), 1 was severe. Frequency of abortive medication: Tylenol, ibuprofen, Excedrin or Goody 2 days a week. Triggers:  Postural, certain smells (cooked meat, formaldehyde), lack of sleep Relieving factors:   Ice, rest Activity:  Severe aggravates  She presented to Urgent Care on 10/04/2019 for intractable migraine which was treated with a headache cocktail.  Past NSAIDS:  Meloxicam; ibuprofen Past analgesics:  Excedrin, Goody Past abortive triptans:  sumatriptan 50mg  Past abortive ergotamine:  none Past muscle relaxants:  Flexeril Past anti-emetic:  Reglan Past antihypertensive medications:  none Past antidepressant medications:  none Past anticonvulsant medications:  none Past anti-CGRP:  none Past vitamins/Herbal/Supplements:  none Past antihistamines/decongestants:  none Other past therapies:  Chiropractic medicine   Family history of headache:  No  PAST MEDICAL HISTORY: Past Medical History:  Diagnosis Date  . IUD (intrauterine device) in place   . Migraine     MEDICATIONS: Current Outpatient Medications on File Prior to Visit  Medication Sig Dispense Refill  . levonorgestrel (MIRENA) 20 MCG/24HR IUD 1 each by Intrauterine route once.    . rizatriptan (MAXALT) 10 MG tablet Take 1 tablet earliest onset of migraine.  May repeat in 2 hours if needed.  Maximum 2 tablets in 24 hours. 10 tablet 3  . SUMAtriptan (IMITREX) 50 MG tablet Take 1 tablet (50 mg total) by mouth every 2 (two) hours as needed for migraine. May repeat in 2 hours if headache persists or recurs. 12 tablet 4  . topiramate (TOPAMAX) 50 MG tablet TAKE 1/2 TABLET AT BEDTIME FOR ONE WEEK, THEN INCREASE TO 1 TABLET AT BEDTIME 30 tablet 0  . [DISCONTINUED] metoCLOPramide (REGLAN) 10 MG tablet Take 1 tablet (10 mg total) by mouth every 6 (six) hours as needed for nausea (or headache). (Patient not taking: Reported on 11/12/2014) 30 tablet 0   No current facility-administered medications on file prior to visit.    ALLERGIES: No Known Allergies  FAMILY HISTORY: Family  History  Problem Relation Age of Onset  . Kidney failure Paternal Grandmother   . Hypertension Father   . Diabetes Mother   . Hypertension  Mother    ***.  SOCIAL HISTORY: Social History   Socioeconomic History  . Marital status: Married    Spouse name: Not on file  . Number of children: Not on file  . Years of education: Not on file  . Highest education level: Not on file  Occupational History  . 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 Smoking status: Never Smoker  . Smokeless tobacco: Never Used  Substance and Sexual Activity  . Alcohol use: Yes  . Drug use: No  . Sexual activity: Not on file    Comment: mirena  Other Topics Concern  . Not on file  Social History Narrative   Right handed   One story   Drinks caffeine 2 cups daily   Social Determinants of Health   Financial Resource Strain: Low Risk   . Difficulty of Paying Living Expenses: Not hard at all  Food Insecurity: No Food Insecurity  . Worried About Charity fundraiser in the Last Year: Never true  . Ran Out of Food in the Last Year: Never true  Transportation Needs: No Transportation Needs  . Lack of Transportation (Medical): No  . Lack of Transportation (Non-Medical): No  Physical Activity:   . Days of Exercise per Week:   . Minutes of Exercise per Session:   Stress:   . Feeling of Stress :   Social Connections:   . Frequency of Communication with Friends and Family:   . Frequency of Social Gatherings with Friends and Family:   . Attends Religious Services:   . Active Member of Clubs or Organizations:   . Attends Archivist Meetings:   Marland Kitchen Marital Status:   Intimate Partner Violence:   . Fear of Current or Ex-Partner:   . Emotionally Abused:   Marland Kitchen Physically Abused:   . Sexually Abused:     PHYSICAL EXAM: *** General: No acute distress.  Patient appears ***-groomed.   Head:  Normocephalic/atraumatic Eyes:  Fundi examined but not visualized Neck: supple, no paraspinal tenderness, full range of motion Heart:  Regular rate and rhythm Lungs:  Clear to auscultation bilaterally Back: No paraspinal  tenderness Neurological Exam: alert and oriented to person, place, and time. Attention span and concentration intact, recent and remote memory intact, fund of knowledge intact.  Speech fluent and not dysarthric, language intact.  CN II-XII intact. Bulk and tone normal, muscle strength 5/5 throughout.  Sensation to light touch, temperature and vibration intact.  Deep tendon reflexes 2+ throughout, toes downgoing.  Finger to nose and heel to shin testing intact.  Gait normal, Romberg negative.  IMPRESSION: ***  PLAN: ***  Metta Clines, DO  CC: ***

## 2020-02-15 ENCOUNTER — Ambulatory Visit: Payer: 59 | Admitting: Neurology

## 2020-03-27 ENCOUNTER — Telehealth: Payer: Self-pay | Admitting: Registered Nurse

## 2020-03-27 ENCOUNTER — Other Ambulatory Visit: Payer: Self-pay

## 2020-03-27 DIAGNOSIS — Z13220 Encounter for screening for lipoid disorders: Secondary | ICD-10-CM

## 2020-03-27 DIAGNOSIS — Z1329 Encounter for screening for other suspected endocrine disorder: Secondary | ICD-10-CM

## 2020-03-27 DIAGNOSIS — Z13228 Encounter for screening for other metabolic disorders: Secondary | ICD-10-CM

## 2020-03-27 NOTE — Telephone Encounter (Signed)
Please order labs for physical scheduled for 05/06/2020

## 2020-05-01 ENCOUNTER — Telehealth: Payer: Self-pay | Admitting: Registered Nurse

## 2020-05-01 NOTE — Telephone Encounter (Signed)
Pt needing orders in for cpe labs for tomorrow 05/02/20. Please advise.

## 2020-05-02 ENCOUNTER — Ambulatory Visit: Payer: 59

## 2020-05-02 ENCOUNTER — Ambulatory Visit: Payer: Self-pay

## 2020-05-02 ENCOUNTER — Other Ambulatory Visit: Payer: Self-pay

## 2020-05-02 ENCOUNTER — Ambulatory Visit: Payer: BC Managed Care – PPO | Admitting: Registered Nurse

## 2020-05-02 DIAGNOSIS — Z0000 Encounter for general adult medical examination without abnormal findings: Secondary | ICD-10-CM | POA: Diagnosis not present

## 2020-05-02 DIAGNOSIS — Z13220 Encounter for screening for lipoid disorders: Secondary | ICD-10-CM | POA: Diagnosis not present

## 2020-05-02 DIAGNOSIS — Z1329 Encounter for screening for other suspected endocrine disorder: Secondary | ICD-10-CM

## 2020-05-02 DIAGNOSIS — Z13228 Encounter for screening for other metabolic disorders: Secondary | ICD-10-CM

## 2020-05-02 NOTE — Telephone Encounter (Signed)
Ordered

## 2020-05-03 LAB — CBC WITH DIFFERENTIAL/PLATELET
Basophils Absolute: 0 10*3/uL (ref 0.0–0.2)
Basos: 0 %
EOS (ABSOLUTE): 0 10*3/uL (ref 0.0–0.4)
Eos: 0 %
Hematocrit: 41.5 % (ref 34.0–46.6)
Hemoglobin: 13.5 g/dL (ref 11.1–15.9)
Immature Grans (Abs): 0 10*3/uL (ref 0.0–0.1)
Immature Granulocytes: 0 %
Lymphocytes Absolute: 1.3 10*3/uL (ref 0.7–3.1)
Lymphs: 40 %
MCH: 29.3 pg (ref 26.6–33.0)
MCHC: 32.5 g/dL (ref 31.5–35.7)
MCV: 90 fL (ref 79–97)
Monocytes Absolute: 0.3 10*3/uL (ref 0.1–0.9)
Monocytes: 8 %
Neutrophils Absolute: 1.6 10*3/uL (ref 1.4–7.0)
Neutrophils: 52 %
Platelets: 189 10*3/uL (ref 150–450)
RBC: 4.6 x10E6/uL (ref 3.77–5.28)
RDW: 12.9 % (ref 11.7–15.4)
WBC: 3.2 10*3/uL — ABNORMAL LOW (ref 3.4–10.8)

## 2020-05-03 LAB — CMP14+EGFR
ALT: 14 IU/L (ref 0–32)
AST: 25 IU/L (ref 0–40)
Albumin/Globulin Ratio: 1.7 (ref 1.2–2.2)
Albumin: 4.6 g/dL (ref 3.8–4.8)
Alkaline Phosphatase: 62 IU/L (ref 48–121)
BUN/Creatinine Ratio: 12 (ref 9–23)
BUN: 10 mg/dL (ref 6–20)
Bilirubin Total: 1.7 mg/dL — ABNORMAL HIGH (ref 0.0–1.2)
CO2: 25 mmol/L (ref 20–29)
Calcium: 9 mg/dL (ref 8.7–10.2)
Chloride: 104 mmol/L (ref 96–106)
Creatinine, Ser: 0.83 mg/dL (ref 0.57–1.00)
GFR calc Af Amer: 106 mL/min/{1.73_m2} (ref >59)
GFR calc non Af Amer: 92 mL/min/{1.73_m2} (ref >59)
Globulin, Total: 2.7 g/dL (ref 1.5–4.5)
Glucose: 85 mg/dL (ref 65–99)
Potassium: 3.8 mmol/L (ref 3.5–5.2)
Sodium: 142 mmol/L (ref 134–144)
Total Protein: 7.3 g/dL (ref 6.0–8.5)

## 2020-05-03 LAB — LIPID PANEL
Chol/HDL Ratio: 2.5 ratio (ref 0.0–4.4)
Cholesterol, Total: 146 mg/dL (ref 100–199)
HDL: 58 mg/dL (ref >39)
LDL Chol Calc (NIH): 79 mg/dL (ref 0–99)
Triglycerides: 36 mg/dL (ref 0–149)
VLDL Cholesterol Cal: 9 mg/dL (ref 5–40)

## 2020-05-03 LAB — TSH: TSH: 1.5 u[IU]/mL (ref 0.450–4.500)

## 2020-05-05 LAB — SPECIMEN STATUS REPORT

## 2020-05-05 LAB — HEMOGLOBIN A1C
Est. average glucose Bld gHb Est-mCnc: 114 mg/dL
Hgb A1c MFr Bld: 5.6 % (ref 4.8–5.6)

## 2020-05-06 ENCOUNTER — Encounter: Payer: Self-pay | Admitting: Registered Nurse

## 2020-05-06 ENCOUNTER — Other Ambulatory Visit: Payer: Self-pay

## 2020-05-06 ENCOUNTER — Ambulatory Visit (INDEPENDENT_AMBULATORY_CARE_PROVIDER_SITE_OTHER): Payer: BC Managed Care – PPO | Admitting: Registered Nurse

## 2020-05-06 ENCOUNTER — Encounter: Payer: 59 | Admitting: Registered Nurse

## 2020-05-06 VITALS — BP 114/74 | HR 60 | Temp 97.6°F | Ht 66.0 in | Wt 168.8 lb

## 2020-05-06 DIAGNOSIS — Z23 Encounter for immunization: Secondary | ICD-10-CM | POA: Diagnosis not present

## 2020-05-06 DIAGNOSIS — Z1159 Encounter for screening for other viral diseases: Secondary | ICD-10-CM

## 2020-05-06 DIAGNOSIS — Z5320 Procedure and treatment not carried out because of patient's decision for unspecified reasons: Secondary | ICD-10-CM

## 2020-05-06 DIAGNOSIS — Z0000 Encounter for general adult medical examination without abnormal findings: Secondary | ICD-10-CM

## 2020-05-06 NOTE — Progress Notes (Signed)
Established Patient Office Visit  Subjective:  Patient ID: Courtney Arellano, female    DOB: November 23, 1985  Age: 34 y.o. MRN: [id number]  CC:  Chief Complaint  Patient presents with  . Annual Exam    HPI Courtney Arellano presents for CPE and lab review  Had labs drawn within a week ago. Reviewed with patient. Show mildly elevated bilirubin with no clinical correlation. Pt concerned given family history of hepatitis Reports she has been vaccinated, no known exposure, no symptoms. Offered hepatitis panel, pt declined today but will consider in future.  Otherwise labs wnl  No urgent concerns or complaints today. No migraines in around 6 mos - using rizatriptan and topamax for these when they do occur.  Mirena for [facility]. Happy with this. No complaints.  Past Medical History:  Diagnosis Date  . IUD (intrauterine device) in place   . Migraine     No past surgical history on file.  Family History  Problem Relation Age of Onset  . Kidney failure Paternal Grandmother   . Hypertension Father   . Diabetes Mother   . Hypertension Mother     Social History   Socioeconomic History  . Marital status: Married    Spouse name: Not on file  . Number of children: Not on file  . Years of education: Not on file  . Highest education level: Not on file  Occupational History  . 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 Smoking status: Never Smoker  . Smokeless tobacco: Never Used  Vaping Use  . Vaping Use: Never used  Substance and Sexual Activity  . Alcohol use: Yes  . Drug use: No  . Sexual activity: Not on file    Comment: mirena  Other Topics Concern  . Not on file  Social History Narrative   Right handed   One story   Drinks caffeine 2 cups daily   Social Determinants of Health   Financial Resource Strain: Low Risk   . Difficulty of Paying Living Expenses: Not hard at all  Food Insecurity: No Food Insecurity  . Worried About Programme researcher, broadcasting/film/video in the Last Year: Never  true  . Ran Out of Food in the Last Year: Never true  Transportation Needs: No Transportation Needs  . Lack of Transportation (Medical): No  . Lack of Transportation (Non-Medical): No  Physical Activity:   . Days of Exercise per Week:   . Minutes of Exercise per Session:   Stress:   . Feeling of Stress :   Social Connections:   . Frequency of Communication with Friends and Family:   . Frequency of Social Gatherings with Friends and Family:   . Attends Religious Services:   . Active Member of Clubs or Organizations:   . Attends Banker Meetings:   Marland Kitchen Marital Status:   Intimate Partner Violence:   . Fear of Current or Ex-Partner:   . Emotionally Abused:   Marland Kitchen Physically Abused:   . Sexually Abused:     Outpatient Medications Prior to Visit  Medication Sig Dispense Refill  . rizatriptan (MAXALT) 10 MG tablet Take 1 tablet earliest onset of migraine.  May repeat in 2 hours if needed.  Maximum 2 tablets in 24 hours. 10 tablet 3  . levonorgestrel (MIRENA) 20 MCG/24HR IUD 1 each by Intrauterine route once. (Patient not taking: Reported on 05/06/2020)    . SUMAtriptan (IMITREX) 50 MG tablet Take 1 tablet (50 mg total) by mouth every 2 (two) hours as  needed for migraine. May repeat in 2 hours if headache persists or recurs. (Patient not taking: Reported on 05/06/2020) 12 tablet 4  . topiramate (TOPAMAX) 50 MG tablet TAKE 1/2 TABLET AT BEDTIME FOR ONE WEEK, THEN INCREASE TO 1 TABLET AT BEDTIME (Patient not taking: Reported on 05/06/2020) 30 tablet 0   No facility-administered medications prior to visit.    No Known Allergies  ROS Review of Systems  Constitutional: Negative.   HENT: Negative.   Eyes: Negative.   Respiratory: Negative.   Cardiovascular: Negative.   Gastrointestinal: Negative.   Endocrine: Negative.   Genitourinary: Negative.   Musculoskeletal: Negative.   Skin: Negative.   Allergic/Immunologic: Negative.   Neurological: Negative.   Hematological:  Negative.   Psychiatric/Behavioral: Negative.   All other systems reviewed and are negative.     Objective:    Physical Exam Nursing note reviewed.  Constitutional:      General: She is not in acute distress.    Appearance: Normal appearance. She is normal weight. She is not ill-appearing, toxic-appearing or diaphoretic.  HENT:     Head: Normocephalic and atraumatic.     Right Ear: Tympanic membrane, ear canal and external ear normal. There is no impacted cerumen.     Left Ear: Tympanic membrane, ear canal and external ear normal. There is no impacted cerumen.     Nose: Nose normal. No congestion or rhinorrhea.     Mouth/Throat:     Mouth: Mucous membranes are moist.     Pharynx: Oropharynx is clear. No oropharyngeal exudate or posterior oropharyngeal erythema.  Eyes:     General: No scleral icterus.       Right eye: No discharge.        Left eye: No discharge.     Extraocular Movements: Extraocular movements intact.     Conjunctiva/sclera: Conjunctivae normal.     Pupils: Pupils are equal, round, and reactive to light.  Neck:     Vascular: No carotid bruit.  Cardiovascular:     Rate and Rhythm: Normal rate and regular rhythm.     Pulses: Normal pulses.     Heart sounds: Normal heart sounds. No murmur heard.  No friction rub. No gallop.   Pulmonary:     Effort: Pulmonary effort is normal. No respiratory distress.     Breath sounds: Normal breath sounds. No stridor. No wheezing, rhonchi or rales.  Chest:     Chest wall: No tenderness.  Abdominal:     General: Abdomen is flat. Bowel sounds are normal. There is no distension.     Palpations: Abdomen is soft. There is no mass.     Tenderness: There is no abdominal tenderness. There is no right CVA tenderness, left CVA tenderness, guarding or rebound.     Hernia: No hernia is present.  Musculoskeletal:        General: No swelling, tenderness, deformity or signs of injury. Normal range of motion.     Cervical back: Normal  range of motion and neck supple. No rigidity or tenderness.     Right lower leg: No edema.     Left lower leg: No edema.  Lymphadenopathy:     Cervical: No cervical adenopathy.  Skin:    General: Skin is warm and dry.     Capillary Refill: Capillary refill takes less than 2 seconds.     Coloration: Skin is not jaundiced or pale.     Findings: No bruising, erythema, lesion or rash.  Neurological:  General: No focal deficit present.     Mental Status: She is alert and oriented to person, place, and time. Mental status is at baseline.     Cranial Nerves: No cranial nerve deficit.     Sensory: No sensory deficit.     Motor: No weakness.     Coordination: Coordination normal.     Gait: Gait normal.     Deep Tendon Reflexes: Reflexes normal.  Psychiatric:        Mood and Affect: Mood normal.        Behavior: Behavior normal.        Thought Content: Thought content normal.        Judgment: Judgment normal.     BP 114/74 (BP Location: Right Arm, Patient Position: Sitting, Cuff Size: Normal)   Pulse 60   Temp 97.6 F (36.4 C) (Temporal)   Ht 5\' 6"  (1.676 m)   Wt 168 lb 12.8 oz (76.6 kg)   SpO2 98%   BMI 27.25 kg/m  Wt Readings from Last 3 Encounters:  05/06/20 168 lb 12.8 oz (76.6 kg)  10/10/19 177 lb (80.3 kg)  08/07/19 173 lb (78.5 kg)     Health Maintenance Due  Topic Date Due  . COVID-19 Vaccine (1) Never done    There are no preventive care reminders to display for this patient.  Lab Results  Component Value Date   TSH 1.500 05/02/2020   Lab Results  Component Value Date   WBC 3.2 (L) 05/02/2020   HGB 13.5 05/02/2020   HCT 41.5 05/02/2020   MCV 90 05/02/2020   PLT 189 05/02/2020   Lab Results  Component Value Date   NA 142 05/02/2020   K 3.8 05/02/2020   CO2 25 05/02/2020   GLUCOSE 85 05/02/2020   BUN 10 05/02/2020   CREATININE 0.83 05/02/2020   BILITOT 1.7 (H) 05/02/2020   ALKPHOS 62 05/02/2020   AST 25 05/02/2020   ALT 14 05/02/2020   PROT  7.3 05/02/2020   ALBUMIN 4.6 05/02/2020   CALCIUM 9.0 05/02/2020   Lab Results  Component Value Date   CHOL 146 05/02/2020   Lab Results  Component Value Date   HDL 58 05/02/2020   Lab Results  Component Value Date   LDLCALC 79 05/02/2020   Lab Results  Component Value Date   TRIG 36 05/02/2020   Lab Results  Component Value Date   CHOLHDL 2.5 05/02/2020   Lab Results  Component Value Date   HGBA1C 5.6 05/02/2020      Assessment & Plan:   Problem List Items Addressed This Visit    None    Visit Diagnoses    Annual physical exam    -  Primary   Papanicolaou smear declined       Relevant Orders   Ambulatory referral to Gynecology      No orders of the defined types were placed in this encounter.   Follow-up: No follow-ups on file.   PLAN  Exam unremarkable  Labs reviewed, no concerns. Will follow up with symptoms or when patient desires hepatitis panel  Patient encouraged to call clinic with any questions, comments, or concerns.  05/04/2020, NP

## 2020-05-06 NOTE — Patient Instructions (Signed)
° ° ° °  If you have lab work done today you will be contacted with your lab results within the next 2 weeks.  If you have not heard from us then please contact us. The fastest way to get your results is to register for My Chart. ° ° °IF you received an x-ray today, you will receive an invoice from [facility]. Please contact [facility] at [phone] with questions or concerns regarding your invoice.  ° °IF you received labwork today, you will receive an invoice from [facility]. Please contact [facility] at [phone] with questions or concerns regarding your invoice.  ° °Our billing staff will not be able to assist you with questions regarding bills from these companies. ° °You will be contacted with the lab results as soon as they are available. The fastest way to get your results is to activate your My Chart account. Instructions are located on the last page of this paperwork. If you have not heard from us regarding the results in 2 weeks, please contact this office. °  ° ° ° °

## 2020-05-27 ENCOUNTER — Telehealth: Payer: Self-pay | Admitting: Registered Nurse

## 2020-05-27 NOTE — Telephone Encounter (Signed)
Hep C panel has been ordered and patient is schedule for 05/30/20

## 2020-05-27 NOTE — Addendum Note (Signed)
Addended by: Lorenda Hatchet R on: 05/27/2020 05:23 PM   Modules accepted: Orders

## 2020-05-27 NOTE — Telephone Encounter (Signed)
Offered hepatitis panel(05/06/20), pt declined today but will consider in future. She would now like this panel done. Please advise at [phone].

## 2020-05-30 ENCOUNTER — Other Ambulatory Visit: Payer: Self-pay

## 2020-05-30 ENCOUNTER — Ambulatory Visit: Payer: BC Managed Care – PPO

## 2020-05-30 DIAGNOSIS — Z1159 Encounter for screening for other viral diseases: Secondary | ICD-10-CM

## 2020-05-30 DIAGNOSIS — G43709 Chronic migraine without aura, not intractable, without status migrainosus: Secondary | ICD-10-CM

## 2020-05-30 DIAGNOSIS — Z0000 Encounter for general adult medical examination without abnormal findings: Secondary | ICD-10-CM

## 2020-05-30 LAB — POCT CBC
Granulocyte percent: 39.7 %G (ref 37–80)
HCT, POC: 43.3 % — AB (ref 29–41)
Hemoglobin: 14.1 g/dL (ref 11–14.6)
Lymph, poc: 1.7 (ref 0.6–3.4)
MCH, POC: 29.7 pg (ref 27–31.2)
MCHC: 32.6 g/dL (ref 31.8–35.4)
MCV: 91 fL (ref 76–111)
MID (cbc): 0.2 (ref 0–0.9)
MPV: 7.2 fL (ref 0–99.8)
POC Granulocyte: 1.3 — AB (ref 2–6.9)
POC LYMPH PERCENT: 53.4 %L — AB (ref 10–50)
POC MID %: 6.9 %M (ref 0–12)
Platelet Count, POC: 197 10*3/uL (ref 142–424)
RBC: 4.76 M/uL (ref 4.04–5.48)
RDW, POC: 13.6 %
WBC: 3.2 10*3/uL — AB (ref 4.6–10.2)

## 2020-05-31 LAB — LIPID PANEL
Chol/HDL Ratio: 2.8 ratio (ref 0.0–4.4)
Cholesterol, Total: 154 mg/dL (ref 100–199)
HDL: 55 mg/dL (ref >39)
LDL Chol Calc (NIH): 89 mg/dL (ref 0–99)
Triglycerides: 47 mg/dL (ref 0–149)
VLDL Cholesterol Cal: 10 mg/dL (ref 5–40)

## 2020-05-31 LAB — COMPREHENSIVE METABOLIC PANEL
ALT: 17 IU/L (ref 0–32)
AST: 23 IU/L (ref 0–40)
Albumin/Globulin Ratio: 1.7 (ref 1.2–2.2)
Albumin: 4.8 g/dL (ref 3.8–4.8)
Alkaline Phosphatase: 60 IU/L (ref 48–121)
BUN/Creatinine Ratio: 15 (ref 9–23)
BUN: 14 mg/dL (ref 6–20)
Bilirubin Total: 1.4 mg/dL — ABNORMAL HIGH (ref 0.0–1.2)
CO2: 26 mmol/L (ref 20–29)
Calcium: 9.5 mg/dL (ref 8.7–10.2)
Chloride: 101 mmol/L (ref 96–106)
Creatinine, Ser: 0.94 mg/dL (ref 0.57–1.00)
GFR calc Af Amer: 91 mL/min/{1.73_m2} (ref >59)
GFR calc non Af Amer: 79 mL/min/{1.73_m2} (ref >59)
Globulin, Total: 2.9 g/dL (ref 1.5–4.5)
Glucose: 106 mg/dL — ABNORMAL HIGH (ref 65–99)
Potassium: 3.6 mmol/L (ref 3.5–5.2)
Sodium: 141 mmol/L (ref 134–144)
Total Protein: 7.7 g/dL (ref 6.0–8.5)

## 2020-05-31 LAB — HEPATITIS C ANTIBODY: Hep C Virus Ab: <0.1 s/co ratio (ref 0.0–0.9)

## 2020-06-12 ENCOUNTER — Encounter: Payer: Self-pay | Admitting: Obstetrics and Gynecology

## 2020-06-12 ENCOUNTER — Ambulatory Visit (INDEPENDENT_AMBULATORY_CARE_PROVIDER_SITE_OTHER): Payer: BC Managed Care – PPO | Admitting: Obstetrics and Gynecology

## 2020-06-12 ENCOUNTER — Other Ambulatory Visit: Payer: Self-pay

## 2020-06-12 VITALS — BP 118/76 | Ht 68.0 in | Wt 127.0 lb

## 2020-06-12 DIAGNOSIS — N898 Other specified noninflammatory disorders of vagina: Secondary | ICD-10-CM | POA: Diagnosis not present

## 2020-06-12 DIAGNOSIS — Z113 Encounter for screening for infections with a predominantly sexual mode of transmission: Secondary | ICD-10-CM

## 2020-06-12 DIAGNOSIS — Z01419 Encounter for gynecological examination (general) (routine) without abnormal findings: Secondary | ICD-10-CM | POA: Diagnosis not present

## 2020-06-12 LAB — WET PREP FOR TRICH, YEAST, CLUE

## 2020-06-12 NOTE — Progress Notes (Signed)
   Courtney Arellano 1986/07/15 [id number]  SUBJECTIVE:  34 y.o. G1P1001 female new patient here for annual routine gynecologic exam. Currently with Mirena IUD in place and amenorrheic no problems.  New sexual partner and requests STI screen.  She has no gynecologic concerns.  Works in Therapist, sports.  Current Outpatient Medications  Medication Sig Dispense Refill  . levonorgestrel (MIRENA) 20 MCG/24HR IUD 1 each by Intrauterine route once.     . topiramate (TOPAMAX) 50 MG tablet TAKE 1/2 TABLET AT BEDTIME FOR ONE WEEK, THEN INCREASE TO 1 TABLET AT BEDTIME 30 tablet 0  . rizatriptan (MAXALT) 10 MG tablet Take 1 tablet earliest onset of migraine.  May repeat in 2 hours if needed.  Maximum 2 tablets in 24 hours. (Patient not taking: Reported on 06/12/2020) 10 tablet 3   No current facility-administered medications for this visit.   Allergies: Patient has no known allergies.  No LMP recorded. (Menstrual status: IUD).  Past medical history,surgical history, problem list, medications, allergies, family history and social history were all reviewed and documented as reviewed in the EPIC chart.  ROS: Pertinent positives documented in HPI otherwise negative ROS.  GYN ROS: no menses (IUD), no abnormal bleeding, pelvic pain or discharge, no breast pain or new or enlarging lumps on self exam. No neurological complaints.    OBJECTIVE:  BP 118/76   Ht 5\' 8"  (1.727 m)   Wt 127 lb (57.6 kg)   BMI 19.31 kg/m  The patient appears well, alert, oriented x 3, in no distress. ENT normal.  Neck supple. No cervical or supraclavicular adenopathy or thyromegaly.  Lungs are clear, good air entry, no wheezes, rhonchi or rales. S1 and S2 normal, no murmurs, regular rate and rhythm.  Abdomen soft without tenderness, guarding, mass or organomegaly.  Neurological is normal, no focal findings.  BREAST EXAM: breasts appear normal, no suspicious masses, no skin or nipple changes or axillary nodes  PELVIC EXAM:  VULVA: normal appearing vulva with no masses, tenderness or lesions, VAGINA: normal appearing vagina with normal color and discharge, no lesions, CERVIX: IUD strings 3 cm at external os, normal appearing cervix without discharge or lesions, UTERUS: uterus is normal size, shape, consistency and nontender, ADNEXA: normal adnexa in size, nontender and no masses, WET MOUNT done - results: negative for pathogens, normal epithelial cells  (DNP student) present during the examination and performed the pelvic exam with me in attendance to confirm the exam findings.   ASSESSMENT:  34 y.o. G1P1001 here for annual gynecologic exam  PLAN:   1. No hormonal or menstrual concerns.  2. Pap smear 05/2019 (per patient) reports normal Pap smear.  I do not have the records for this.  Reports no history of abnormal Pap smears.  Next Pap smear due 2023 following the current guidelines recommending the 3 year interval. 3. Contraception.  Mirena IUD placed 08/2019.  Doing well no concerns.  IUD strings visualized on exam today. 4. Breast exam normal.  No family history of breast cancer.  Encouraged self breast awareness, notify 35/2020 if any concerns. 5. STI screening requested.  Wet mount negative for trichomonas, also collected GC/chlamydia, serum screening for hepatitis B/C, RPR, HIV. 6. Health maintenance.  She recently had routine lab panel screening completed with her primary care provider.    Return annually or sooner, prn.  [place] MD 06/12/20

## 2020-06-13 LAB — HEPATITIS C ANTIBODY
Hepatitis C Ab: NONREACTIVE
SIGNAL TO CUT-OFF: 0.03 (ref <1.00)

## 2020-06-13 LAB — HIV ANTIBODY (ROUTINE TESTING W REFLEX): HIV 1&2 Ab, 4th Generation: NONREACTIVE

## 2020-06-13 LAB — HEPATITIS B SURFACE ANTIGEN: Hepatitis B Surface Ag: NONREACTIVE

## 2020-06-13 LAB — RPR: RPR Ser Ql: NONREACTIVE

## 2020-06-14 LAB — C. TRACHOMATIS/N. GONORRHOEAE RNA
C. trachomatis RNA, TMA: NOT DETECTED
N. gonorrhoeae RNA, TMA: NOT DETECTED

## 2020-06-22 ENCOUNTER — Encounter: Payer: Self-pay | Admitting: Registered Nurse

## 2020-06-22 DIAGNOSIS — D72819 Decreased white blood cell count, unspecified: Secondary | ICD-10-CM

## 2020-07-14 ENCOUNTER — Telehealth: Payer: Self-pay | Admitting: Hematology and Oncology

## 2020-07-14 NOTE — Telephone Encounter (Signed)
Received a new hem referral from [facility] for leukocytosis. Courtney Arellano returned my call and has been scheduled to see Dr. Bertis Ruddy on 10/4 at 12pm. Pt aware to arrive 30 minutes early.

## 2020-07-21 ENCOUNTER — Inpatient Hospital Stay: Payer: BC Managed Care – PPO

## 2020-07-21 ENCOUNTER — Other Ambulatory Visit: Payer: Self-pay

## 2020-07-21 ENCOUNTER — Telehealth: Payer: Self-pay | Admitting: Hematology and Oncology

## 2020-07-21 ENCOUNTER — Encounter: Payer: Self-pay | Admitting: Hematology and Oncology

## 2020-07-21 ENCOUNTER — Inpatient Hospital Stay: Payer: BC Managed Care – PPO | Attending: Hematology and Oncology | Admitting: Hematology and Oncology

## 2020-07-21 DIAGNOSIS — D72818 Other decreased white blood cell count: Secondary | ICD-10-CM

## 2020-07-21 DIAGNOSIS — R17 Unspecified jaundice: Secondary | ICD-10-CM

## 2020-07-21 DIAGNOSIS — Z840 Family history of diseases of the skin and subcutaneous tissue: Secondary | ICD-10-CM | POA: Diagnosis not present

## 2020-07-21 DIAGNOSIS — D72819 Decreased white blood cell count, unspecified: Secondary | ICD-10-CM | POA: Insufficient documentation

## 2020-07-21 LAB — VITAMIN B12: Vitamin B-12: 331 pg/mL (ref 180–914)

## 2020-07-21 LAB — CBC WITH DIFFERENTIAL/PLATELET
Abs Immature Granulocytes: 0.01 10*3/uL (ref 0.00–0.07)
Basophils Absolute: 0 10*3/uL (ref 0.0–0.1)
Basophils Relative: 0 %
Eosinophils Absolute: 0.1 10*3/uL (ref 0.0–0.5)
Eosinophils Relative: 1 %
HCT: 42.1 % (ref 36.0–46.0)
Hemoglobin: 13.8 g/dL (ref 12.0–15.0)
Immature Granulocytes: 0 %
Lymphocytes Relative: 44 %
Lymphs Abs: 1.6 10*3/uL (ref 0.7–4.0)
MCH: 29.8 pg (ref 26.0–34.0)
MCHC: 32.8 g/dL (ref 30.0–36.0)
MCV: 90.9 fL (ref 80.0–100.0)
Monocytes Absolute: 0.3 10*3/uL (ref 0.1–1.0)
Monocytes Relative: 7 %
Neutro Abs: 1.7 10*3/uL (ref 1.7–7.7)
Neutrophils Relative %: 48 %
Platelets: 213 10*3/uL (ref 150–400)
RBC: 4.63 MIL/uL (ref 3.87–5.11)
RDW: 13.2 % (ref 11.5–15.5)
WBC: 3.6 10*3/uL — ABNORMAL LOW (ref 4.0–10.5)
nRBC: 0 % (ref 0.0–0.2)

## 2020-07-21 LAB — SEDIMENTATION RATE: Sed Rate: 8 mm/hr (ref 0–22)

## 2020-07-21 NOTE — Telephone Encounter (Signed)
Left message to verify mychart video visit for pre reg 

## 2020-07-21 NOTE — Progress Notes (Signed)
[facility] CONSULT NOTE  Patient Care Team: Maximiano Coss, NP as PCP - General (Adult Health Nurse Practitioner) Pieter Partridge, DO as Consulting Physician (Neurology)  CHIEF COMPLAINTS/PURPOSE OF CONSULTATION:  Chronic leukopenia  HISTORY OF PRESENTING ILLNESS:  Courtney Arellano 34 y.o. female is here because of chronic leukopenia  She was found to have abnormal CBC from CBC draw from primary care doctor's office Her total white count from August 2021 was mildly reduced at 3.2 9 years ago, when her daughter was born, she has normal CBC except for mild anemia and thrombocytopenia She denies history of recurrent infection She denies recent infection. The last prescription antibiotics was more than 3 months ago There is not reported symptoms of sinus congestion, cough, urinary frequency/urgency or dysuria, diarrhea, joint swelling/pain or abnormal skin rash.  She had no prior history or diagnosis of cancer. Her age appropriate screening programs are up-to-date. The patient has no prior diagnosis of autoimmune disease and was not prescribed corticosteroids related products. Her mother was diagnosed with systemic lupus with other health issues  MEDICAL HISTORY:  Past Medical History:  Diagnosis Date  . IUD (intrauterine device) in place   . Migraine     SURGICAL HISTORY: Past Surgical History:  Procedure Laterality Date  . ROOT CANAL Right     SOCIAL HISTORY: Social History   Socioeconomic History  . Marital status: Married    Spouse name: Not on file  . Number of children: 1  . Years of education: Not on file  . Highest education level: Not on file  Occupational History  . 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 Smoking status: Never Smoker  . Smokeless tobacco: Never Used  Vaping Use  . Vaping Use: Never used  Substance and Sexual Activity  . Alcohol use: Yes    Comment: Social  . Drug use: No  . Sexual activity: Yes    Birth  control/protection: I.U.D.    Comment: mirena inserted 08-2019-1st intercourse 34 yo-More than 5 partners  Other Topics Concern  . Not on file  Social History Narrative   Right handed   One story   Drinks caffeine 2 cups daily   Social Determinants of Health   Financial Resource Strain:   . Difficulty of Paying Living Expenses: Not on file  Food Insecurity:   . Worried About Charity fundraiser in the Last Year: Not on file  . Ran Out of Food in the Last Year: Not on file  Transportation Needs:   . Lack of Transportation (Medical): Not on file  . Lack of Transportation (Non-Medical): Not on file  Physical Activity:   . Days of Exercise per Week: Not on file  . Minutes of Exercise per Session: Not on file  Stress:   . Feeling of Stress : Not on file  Social Connections:   . Frequency of Communication with Friends and Family: Not on file  . Frequency of Social Gatherings with Friends and Family: Not on file  . Attends Religious Services: Not on file  . Active Member of Clubs or Organizations: Not on file  . Attends Archivist Meetings: Not on file  . Marital Status: Not on file  Intimate Partner Violence:   . Fear of Current or Ex-Partner: Not on file  . Emotionally Abused: Not on file  . Physically Abused: Not on file  . Sexually Abused: Not on file    FAMILY HISTORY: Family History  Problem Relation Age of  Onset  . Kidney failure Paternal Grandmother   . Hypertension Father   . Diabetes Mother   . Hypertension Mother   . Lupus Mother   . Kidney failure Mother     ALLERGIES:  has No Known Allergies.  MEDICATIONS:  Current Outpatient Medications  Medication Sig Dispense Refill  . levonorgestrel (MIRENA) 20 MCG/24HR IUD 1 each by Intrauterine route once.      No current facility-administered medications for this visit.    REVIEW OF SYSTEMS:   Constitutional: Denies fevers, chills or abnormal night sweats Eyes: Denies blurriness of vision, double  vision or watery eyes Ears, nose, mouth, throat, and face: Denies mucositis or sore throat Respiratory: Denies cough, dyspnea or wheezes Cardiovascular: Denies palpitation, chest discomfort or lower extremity swelling Gastrointestinal:  Denies nausea, heartburn or change in bowel habits Skin: Denies abnormal skin rashes Lymphatics: Denies new lymphadenopathy or easy bruising Neurological:Denies numbness, tingling or new weaknesses Behavioral/Psych: Mood is stable, no new changes  All other systems were reviewed with the patient and are negative.  PHYSICAL EXAMINATION: ECOG PERFORMANCE STATUS: 0 - Asymptomatic  Vitals:   07/21/20 1200  BP: 113/65  Pulse: (!) 53  Resp: 20  Temp: (!) 97 F (36.1 C)  SpO2: 100%   Filed Weights   07/21/20 1200  Weight: 150 lb (68 kg)    GENERAL:alert, no distress and comfortable SKIN: skin color, texture, turgor are normal, no rashes or significant lesions EYES: normal, conjunctiva are pink and non-injected, sclera clear OROPHARYNX:no exudate, no erythema and lips, buccal mucosa, and tongue normal  NECK: supple, thyroid normal size, non-tender, without nodularity LYMPH:  no palpable lymphadenopathy in the cervical, axillary or inguinal LUNGS: clear to auscultation and percussion with normal breathing effort HEART: regular rate & rhythm and no murmurs and no lower extremity edema ABDOMEN:abdomen soft, non-tender and normal bowel sounds Musculoskeletal:no cyanosis of digits and no clubbing  PSYCH: alert & oriented x 3 with fluent speech NEURO: no focal motor/sensory deficits  LABORATORY DATA:  I have reviewed the data as listed Recent Results (from the past 2160 hour(s))  CMP14+EGFR     Status: Abnormal   Collection Time: 05/02/20 10:14 AM  Result Value Ref Range   Glucose 85 65 - 99 mg/dL   BUN 10 6 - 20 mg/dL   Creatinine, Ser 0.83 0.57 - 1.00 mg/dL   GFR calc non Af Amer 92 >59 mL/min/1.73   GFR calc Af Amer 106 >59 mL/min/1.73     Comment: **[facility] currently reports eGFR in compliance with the current**   recommendations of the [employer]. [facility] will   update reporting as new guidelines are published from the NKF-ASN   Task force.    BUN/Creatinine Ratio 12 9 - 23   Sodium 142 134 - 144 mmol/L   Potassium 3.8 3.5 - 5.2 mmol/L   Chloride 104 96 - 106 mmol/L   CO2 25 20 - 29 mmol/L   Calcium 9.0 8.7 - 10.2 mg/dL   Total Protein 7.3 6.0 - 8.5 g/dL   Albumin 4.6 3.8 - 4.8 g/dL   Globulin, Total 2.7 1.5 - 4.5 g/dL   Albumin/Globulin Ratio 1.7 1.2 - 2.2   Bilirubin Total 1.7 (H) 0.0 - 1.2 mg/dL   Alkaline Phosphatase 62 48 - 121 IU/L   AST 25 0 - 40 IU/L   ALT 14 0 - 32 IU/L  Lipid panel     Status: None   Collection Time: 05/02/20 10:14 AM  Result Value Ref  Range   Cholesterol, Total 146 100 - 199 mg/dL   Triglycerides 36 0 - 149 mg/dL   HDL 58 >39 mg/dL   VLDL Cholesterol Cal 9 5 - 40 mg/dL   LDL Chol Calc (NIH) 79 0 - 99 mg/dL   Chol/HDL Ratio 2.5 0.0 - 4.4 ratio    Comment:                                   T. Chol/HDL Ratio                                             Men  Women                               1/2 Avg.Risk  3.4    3.3                                   Avg.Risk  5.0    4.4                                2X Avg.Risk  9.6    7.1                                3X Avg.Risk 23.4   11.0   TSH     Status: None   Collection Time: 05/02/20 10:14 AM  Result Value Ref Range   TSH 1.500 0.450 - 4.500 uIU/mL  CBC with Differential/Platelet     Status: Abnormal   Collection Time: 05/02/20 10:14 AM  Result Value Ref Range   WBC 3.2 (L) 3.4 - 10.8 x10E3/uL   RBC 4.60 3.77 - 5.28 x10E6/uL   Hemoglobin 13.5 11.1 - 15.9 g/dL   Hematocrit 41.5 34.0 - 46.6 %   MCV 90 79 - 97 fL   MCH 29.3 26.6 - 33.0 pg   MCHC 32.5 31 - 35 g/dL   RDW 12.9 11.7 - 15.4 %   Platelets 189 150 - 450 x10E3/uL   Neutrophils 52 Not Estab. %   Lymphs 40 Not Estab. %   Monocytes 8 Not Estab. %   Eos 0 Not  Estab. %   Basos 0 Not Estab. %   Neutrophils Absolute 1.6 1 - 7 x10E3/uL   Lymphocytes Absolute 1.3 0 - 3 x10E3/uL   Monocytes Absolute 0.3 0 - 0 x10E3/uL   EOS (ABSOLUTE) 0.0 0.0 - 0.4 x10E3/uL   Basophils Absolute 0.0 0 - 0 x10E3/uL   Immature Granulocytes 0 Not Estab. %   Immature Grans (Abs) 0.0 0.0 - 0.1 x10E3/uL  Hemoglobin A1c     Status: None   Collection Time: 05/02/20 10:14 AM  Result Value Ref Range   Hgb A1c MFr Bld 5.6 4.8 - 5.6 %    Comment:          Prediabetes: 5.7 - 6.4          Diabetes: >6.4          Glycemic control for adults with diabetes: <7.0    Est.  average glucose Bld gHb Est-mCnc 114 mg/dL  Specimen status report     Status: None   Collection Time: 05/02/20 10:14 AM  Result Value Ref Range   specimen status report Comment     Comment: Written Authorization Written Authorization Written Authorization Received. Authorization received from [facility] 05-04-2020 Logged by Valentino Saxon   POCT CBC     Status: Abnormal   Collection Time: 05/30/20 10:37 AM  Result Value Ref Range   WBC 3.2 (A) 4.6 - 10.2 K/uL   Lymph, poc 1.7 0.6 - 3.4   POC LYMPH PERCENT 53.4 (A) 10 - 50 %L   MID (cbc) 0.2 0 - 0.9   POC MID % 6.9 0 - 12 %M   POC Granulocyte 1.3 (A) 2 - 6.9   Granulocyte percent 39.7 37 - 80 %G   RBC 4.76 4.04 - 5.48 M/uL   Hemoglobin 14.1 11 - 14.6 g/dL   HCT, POC 43.3 (A) 29 - 41 %   MCV 91.0 76 - 111 fL   MCH, POC 29.7 27 - 31.2 pg   MCHC 32.6 31.8 - 35.4 g/dL   RDW, POC 13.6 %   Platelet Count, POC 197 142 - 424 K/uL   MPV 7.2 0 - 99.8 fL  Hepatitis C antibody     Status: None   Collection Time: 05/30/20 10:43 AM  Result Value Ref Range   Hep C Virus Ab <0.1 0.0 - 0.9 s/co ratio    Comment:                                   Negative:     < 0.8                              Indeterminate: 0.8 - 0.9                                   Positive:     > 0.9  The CDC recommends that a positive HCV antibody result  be followed up with a HCV  Nucleic Acid Amplification  test ([id number]).   Comprehensive metabolic panel     Status: Abnormal   Collection Time: 05/30/20 10:43 AM  Result Value Ref Range   Glucose 106 (H) 65 - 99 mg/dL   BUN 14 6 - 20 mg/dL   Creatinine, Ser 0.94 0.57 - 1.00 mg/dL   GFR calc non Af Amer 79 >59 mL/min/1.73   GFR calc Af Amer 91 >59 mL/min/1.73    Comment: **[facility] currently reports eGFR in compliance with the current**   recommendations of the [employer]. [facility] will   update reporting as new guidelines are published from the NKF-ASN   Task force.    BUN/Creatinine Ratio 15 9 - 23   Sodium 141 134 - 144 mmol/L   Potassium 3.6 3.5 - 5.2 mmol/L   Chloride 101 96 - 106 mmol/L   CO2 26 20 - 29 mmol/L   Calcium 9.5 8.7 - 10.2 mg/dL   Total Protein 7.7 6.0 - 8.5 g/dL   Albumin 4.8 3.8 - 4.8 g/dL   Globulin, Total 2.9 1.5 - 4.5 g/dL   Albumin/Globulin Ratio 1.7 1.2 - 2.2   Bilirubin Total 1.4 (H) 0.0 - 1.2 mg/dL   Alkaline Phosphatase 60  48 - 121 IU/L   AST 23 0 - 40 IU/L   ALT 17 0 - 32 IU/L  Lipid panel     Status: None   Collection Time: 05/30/20 10:43 AM  Result Value Ref Range   Cholesterol, Total 154 100 - 199 mg/dL   Triglycerides 47 0 - 149 mg/dL   HDL 55 >39 mg/dL   VLDL Cholesterol Cal 10 5 - 40 mg/dL   LDL Chol Calc (NIH) 89 0 - 99 mg/dL   Chol/HDL Ratio 2.8 0.0 - 4.4 ratio    Comment:                                   T. Chol/HDL Ratio                                             Men  Women                               1/2 Avg.Risk  3.4    3.3                                   Avg.Risk  5.0    4.4                                2X Avg.Risk  9.6    7.1                                3X Avg.Risk 23.4   11.0   Hepatitis C antibody     Status: None   Collection Time: 06/12/20  3:52 PM  Result Value Ref Range   Hepatitis C Ab NON-REACTIVE NON-REACTI   SIGNAL TO CUT-OFF 0.03 <1.00    Comment: . HCV antibody was non-reactive. There is no laboratory  evidence of  HCV infection. . In most cases, no further action is required. However, if recent HCV exposure is suspected, a test for HCV RNA (test code [phone]) is suggested. . For additional information please refer to http://education.questdiagnostics.com/faq/FAQ22v1 (This link is being provided for informational/ educational purposes only.) .   RPR     Status: None   Collection Time: 06/12/20  3:52 PM  Result Value Ref Range   RPR Ser Ql NON-REACTIVE NON-REACTI  Hepatitis B surface antigen     Status: None   Collection Time: 06/12/20  3:52 PM  Result Value Ref Range   Hepatitis B Surface Ag NON-REACTIVE NON-REACTI  HIV Antibody (routine testing w rflx)     Status: None   Collection Time: 06/12/20  3:52 PM  Result Value Ref Range   HIV 1&2 Ab, 4th Generation NON-REACTIVE NON-REACTI    Comment: HIV-1 antigen and HIV-1/HIV-2 antibodies were not detected. There is no laboratory evidence of HIV infection. Marland Kitchen PLEASE NOTE: This information has been disclosed to you from records whose confidentiality may be protected by state law.  If your state requires such protection, then the state law prohibits you from making any further disclosure of the information without the specific written  consent of the person to whom it pertains, or as otherwise permitted by law. A general authorization for the release of medical or other information is NOT sufficient for this purpose. . For additional information please refer to http://education.questdiagnostics.com/faq/FAQ106 (This link is being provided for informational/ educational purposes only.) . Marland Kitchen The performance of this assay has not been clinically validated in patients less than 3 years old. Lenard Forth PREP FOR [employer], YEAST, CLUE     Status: None   Collection Time: 06/12/20  3:53 PM   Specimen: Genital  Result Value Ref Range   Source: VAGINA    RESULT      Comment: EPITHELIAL CELLS-PRESENT CLUE CELLS-NONE SEEN YEAST-NONE  SEEN TRICHOMONAS-NONE SEEN WBC-FEW BACTERIA-MODERATE EPITH. CELLS (7-12) HPF   C. trachomatis/N. gonorrhoeae RNA     Status: None   Collection Time: 06/12/20  3:53 PM   Specimen: Genital  Result Value Ref Range   C. trachomatis RNA, TMA NOT DETECTED NOT DETECT   N. gonorrhoeae RNA, TMA NOT DETECTED NOT DETECT    Comment: The analytical performance characteristics of this assay, when used to test SurePath(TM) specimens have been determined by [employer]. The modifications have not been cleared or approved by the FDA. This assay has been validated pursuant to the CLIA regulations and is used for clinical purposes. . For additional information, please refer to https://education.questdiagnostics.com/faq/FAQ154 (This link is being provided for information/ educational purposes only.) .   Vitamin B12     Status: None   Collection Time: 07/21/20 12:51 PM  Result Value Ref Range   Vitamin B-12 331 180 - 914 pg/mL    Comment: (NOTE) This assay is not validated for testing neonatal or myeloproliferative syndrome specimens for Vitamin B12 levels. Performed at [facility], [place] [address] [place], [place] [address]   Sedimentation rate     Status: None   Collection Time: 07/21/20 12:51 PM  Result Value Ref Range   Sed Rate 8 0 - 22 mm/hr    Comment: Performed at [facility], [place] [address] [place], [place] [address]  CBC with Differential/Platelet     Status: Abnormal   Collection Time: 07/21/20 12:51 PM  Result Value Ref Range   WBC 3.6 (L) 4.0 - 10.5 K/uL   RBC 4.63 3.87 - 5.11 MIL/uL   Hemoglobin 13.8 12.0 - 15.0 g/dL   HCT 42.1 36 - 46 %   MCV 90.9 80.0 - 100.0 fL   MCH 29.8 26.0 - 34.0 pg   MCHC 32.8 30.0 - 36.0 g/dL   RDW 13.2 11.5 - 15.5 %   Platelets 213 150 - 400 K/uL   nRBC 0.0 0.0 - 0.2 %   Neutrophils Relative % 48 %   Neutro Abs 1.7 1.7 - 7.7 K/uL   Lymphocytes Relative 44 %   Lymphs Abs 1.6 0.7 - 4.0 K/uL    Monocytes Relative 7 %   Monocytes Absolute 0.3 0 - 1 K/uL   Eosinophils Relative 1 %   Eosinophils Absolute 0.1 0 - 0 K/uL   Basophils Relative 0 %   Basophils Absolute 0.0 0 - 0 K/uL   Immature Granulocytes 0 %   Abs Immature Granulocytes 0.01 0.00 - 0.07 K/uL    Comment: Performed at [facility], [place] [address], [place], [place] [address]    ASSESSMENT & PLAN  Leukopenia The cause is unknown It could be due to African-American heritage Even though she has normal total  white count in 2012, it could be inappropriately elevated in the setting of stress from pregnancy I will order B12, autoimmune screen and others I will set up virtual visit tomorrow to discuss test results  Elevated bilirubin She is concerned that her elevated bilirubin is due to hepatitis B Based on recent test results, I reassured the patient it does not look like she have chronic hepatitis B The elevated total bilirubin could be due to Gilbert's  Family history of lupus erythematosus She has no clinical signs or symptoms to suggest active lupus I will order sedimentation rate and screening for lupus   Orders Placed This Encounter  Procedures  . CBC with Differential/Platelet    Standing Status:   Standing    Number of Occurrences:   22    Standing Expiration Date:   07/21/2021  . Sedimentation rate    Standing Status:   Future    Number of Occurrences:   1    Standing Expiration Date:   07/21/2021  . Vitamin B12    Standing Status:   Future    Number of Occurrences:   1    Standing Expiration Date:   07/21/2021  . ANA, IFA (with reflex)    Standing Status:   Future    Number of Occurrences:   1    Standing Expiration Date:   07/21/2021    All questions were answered. The patient knows to call the clinic with any problems, questions or concerns. I spent 55 minutes counseling the patient face to face and review of plan of care    Heath Lark, MD 07/21/2020 3:17 PM

## 2020-07-21 NOTE — Assessment & Plan Note (Signed)
She has no clinical signs or symptoms to suggest active lupus I will order sedimentation rate and screening for lupus

## 2020-07-21 NOTE — Assessment & Plan Note (Signed)
She is concerned that her elevated bilirubin is due to hepatitis B Based on recent test results, I reassured the patient it does not look like she have chronic hepatitis B The elevated total bilirubin could be due to Gilbert's

## 2020-07-21 NOTE — Assessment & Plan Note (Addendum)
The cause is unknown It could be due to African-American heritage Even though she has normal total white count in 2012, it could be inappropriately elevated in the setting of stress from pregnancy I will order B12, autoimmune screen and others I will set up virtual visit tomorrow to discuss test results

## 2020-07-22 ENCOUNTER — Telehealth: Payer: Self-pay

## 2020-07-22 ENCOUNTER — Ambulatory Visit: Payer: BC Managed Care – PPO | Admitting: Hematology and Oncology

## 2020-07-22 ENCOUNTER — Inpatient Hospital Stay: Payer: BC Managed Care – PPO | Admitting: Hematology and Oncology

## 2020-07-22 LAB — ANTINUCLEAR ANTIBODIES, IFA: ANA Ab, IFA: NEGATIVE

## 2020-07-22 NOTE — Telephone Encounter (Signed)
Pt returned call and is in agreement with 07/28/20 at 1345. Message sent to scheduling to arrange.

## 2020-07-22 NOTE — Telephone Encounter (Signed)
Attempted to call pt regarding appt for today. ANA lab not yet resulted and Dr Bertis Ruddy needs this lab back in order to speak with pt. Called pt to ask if we could schedule appt for Monday at 1345 e-visit per Dr Bertis Ruddy, but pt did not answer. Asked pt to return call to let us know if we can arrange this appt.

## 2020-07-28 ENCOUNTER — Encounter: Payer: Self-pay | Admitting: Hematology and Oncology

## 2020-07-28 ENCOUNTER — Other Ambulatory Visit: Payer: Self-pay

## 2020-07-28 ENCOUNTER — Inpatient Hospital Stay (HOSPITAL_BASED_OUTPATIENT_CLINIC_OR_DEPARTMENT_OTHER): Payer: BC Managed Care – PPO | Admitting: Hematology and Oncology

## 2020-07-28 DIAGNOSIS — D72818 Other decreased white blood cell count: Secondary | ICD-10-CM

## 2020-07-28 DIAGNOSIS — Z840 Family history of diseases of the skin and subcutaneous tissue: Secondary | ICD-10-CM

## 2020-07-28 NOTE — Progress Notes (Signed)
HEMATOLOGY-ONCOLOGY ELECTRONIC VISIT PROGRESS NOTE  Patient Care Team: Courtney Agee, NP as PCP - General (Adult Health Nurse Practitioner) Drema Dallas, DO as Consulting Physician (Neurology)  I connected with by [facility] video conference, which was later converted to telephone visit ASSESSMENT & PLAN:  Leukopenia The most likely cause of her chronic leukopenia is due to her African-American heritage Even though she has normal total white count in 2012, it could be inappropriately elevated in the setting of stress from pregnancy Vitamin B12 level and autoimmune screen are within normal limits She does not need long-term follow-up  Family history of lupus erythematosus Screening for ANA and sedimentation rate are within normal limits   No orders of the defined types were placed in this encounter.   INTERVAL HISTORY: Please see below for problem oriented charting. The purpose of today's visit is to review test results for recent consultation for chronic leukopenia  SUMMARY OF HEMATOLOGIC HISTORY:  She was found to have abnormal CBC from CBC draw from primary care doctor's office Her total white count from August 2021 was mildly reduced at 3.2 9 years ago, when her daughter was born, she has normal CBC except for mild anemia and thrombocytopenia She denies history of recurrent infection She denies recent infection. The last prescription antibiotics was more than 3 months ago There is not reported symptoms of sinus congestion, cough, urinary frequency/urgency or dysuria, diarrhea, joint swelling/pain or abnormal skin rash.  She had no prior history or diagnosis of cancer. Her age appropriate screening programs are up-to-date. The patient has no prior diagnosis of autoimmune disease and was not prescribed corticosteroids related products. Her mother was diagnosed with systemic lupus with other health issues Her blood work confirm low white blood cell count.  Serum vitamin B12 and  autoimmune screen is negative.  She does not need future follow-up with hematology  REVIEW OF SYSTEMS:   Constitutional: Denies fevers, chills or abnormal weight loss Eyes: Denies blurriness of vision Ears, nose, mouth, throat, and face: Denies mucositis or sore throat Respiratory: Denies cough, dyspnea or wheezes Cardiovascular: Denies palpitation, chest discomfort Gastrointestinal:  Denies nausea, heartburn or change in bowel habits Skin: Denies abnormal skin rashes Lymphatics: Denies new lymphadenopathy or easy bruising Neurological:Denies numbness, tingling or new weaknesses Behavioral/Psych: Mood is stable, no new changes  Extremities: No lower extremity edema All other systems were reviewed with the patient and are negative.  I have reviewed the past medical history, past surgical history, social history and family history with the patient and they are unchanged from previous note.  ALLERGIES:  has No Known Allergies.  MEDICATIONS:  Current Outpatient Medications  Medication Sig Dispense Refill  . levonorgestrel (MIRENA) 20 MCG/24HR IUD 1 each by Intrauterine route once.      No current facility-administered medications for this visit.    PHYSICAL EXAMINATION: ECOG PERFORMANCE STATUS: 0 - Asymptomatic  LABORATORY DATA:  I have reviewed the data as listed CMP Latest Ref Rng & Units 05/30/2020 05/02/2020  Glucose 65 - 99 mg/dL 626(R) 85  BUN 6 - 20 mg/dL 14 10  Creatinine 4.85 - 1.00 mg/dL 4.62 7.03  Sodium 500 - 144 mmol/L 141 142  Potassium 3.5 - 5.2 mmol/L 3.6 3.8  Chloride 96 - 106 mmol/L 101 104  CO2 20 - 29 mmol/L 26 25  Calcium 8.7 - 10.2 mg/dL 9.5 9.0  Total Protein 6.0 - 8.5 g/dL 7.7 7.3  Total Bilirubin 0.0 - 1.2 mg/dL 9.3(G) 1.8(E)  Alkaline Phos 48 - 121 IU/L  60 62  AST 0 - 40 IU/L 23 25  ALT 0 - 32 IU/L 17 14    Lab Results  Component Value Date   WBC 3.6 (L) 07/21/2020   HGB 13.8 07/21/2020   HCT 42.1 07/21/2020   MCV 90.9 07/21/2020   PLT 213  07/21/2020   NEUTROABS 1.7 07/21/2020     I discussed the assessment and treatment plan with the patient. The patient was provided an opportunity to ask questions and all were answered. The patient agreed with the plan and demonstrated an understanding of the instructions. The patient was advised to call back or seek an in-person evaluation if the symptoms worsen or if the condition fails to improve as anticipated.    I spent 10 minutes for the appointment reviewing test results, discuss management and coordination of care.  Artis Delay, MD 07/28/2020 2:04 PM

## 2020-07-28 NOTE — Assessment & Plan Note (Signed)
Screening for ANA and sedimentation rate are within normal limits

## 2020-07-28 NOTE — Assessment & Plan Note (Signed)
The most likely cause of her chronic leukopenia is due to her African-American heritage Even though she has normal total white count in 2012, it could be inappropriately elevated in the setting of stress from pregnancy Vitamin B12 level and autoimmune screen are within normal limits She does not need long-term follow-up

## 2021-01-09 ENCOUNTER — Other Ambulatory Visit: Payer: Self-pay

## 2021-01-09 ENCOUNTER — Ambulatory Visit (HOSPITAL_COMMUNITY)
Admission: EM | Admit: 2021-01-09 | Discharge: 2021-01-09 | Disposition: A | Discharge status: Home / Self Care | Payer: BC Managed Care – PPO | Attending: Medical Oncology | Admitting: Medical Oncology

## 2021-01-09 ENCOUNTER — Encounter (HOSPITAL_COMMUNITY): Payer: Self-pay

## 2021-01-09 DIAGNOSIS — J029 Acute pharyngitis, unspecified: Secondary | ICD-10-CM | POA: Insufficient documentation

## 2021-01-09 LAB — POCT RAPID STREP A, ED / UC: Streptococcus, Group A Screen (Direct): POSITIVE — AB

## 2021-01-09 MED ORDER — PREDNISONE 10 MG PO TABS: 20.0000 mg | ORAL_TABLET | Freq: Every day | ORAL | 0 refills | Status: AC

## 2021-01-09 MED ORDER — AMOXICILLIN 500 MG PO CAPS: 500.0000 mg | ORAL_CAPSULE | Freq: Three times a day (TID) | ORAL | 0 refills | Status: DC

## 2021-01-09 NOTE — ED Triage Notes (Signed)
Pt presents with a sore throat x 3 days. She states she has taken ibuprofen and states she is unable to talk and swallow. Pt states it is hard for her to breathe.

## 2021-01-09 NOTE — ED Provider Notes (Addendum)
[facility]    CSN: [id number] Arrival date & time: 01/09/21  0623      History   Chief Complaint Chief Complaint  Patient presents with  . Sore Throat    HPI Courtney Arellano is a 35 y.o. female.   HPI   Sore Throat: Pt states that 2 days ago she developed a sore throat. Progressively worsening. She states that symptoms have worsened over the past day and has notice white patches on her throat along with uvula swelling. She has associated trouble swallowing and feels her uvula when she breathes. She has tried ibuprofen 800 mg for symptoms with improvement. No fevers, rash, vomiting, cold symptoms. Positive for new sexual partner.   Past Medical History:  Diagnosis Date  . IUD (intrauterine device) in place   . Migraine     Patient Active Problem List   Diagnosis Date Noted  . Leukopenia 07/21/2020  . Family history of lupus erythematosus 07/21/2020  . Elevated bilirubin 07/21/2020  . Candidiasis of vagina 08/07/2019  . Chronic migraine without aura without status migrainosus, not intractable 07/10/2019  . Migraine 05/17/2017  . IUD contraception 08/27/2014    Past Surgical History:  Procedure Laterality Date  . ROOT CANAL Right     OB History    Gravida  1   Para  1   Term  1   Preterm      AB      Living  1     SAB      IAB      Ectopic      Multiple      Live Births               Home Medications    Prior to Admission medications   Medication Sig Start Date End Date Taking? Authorizing Provider  levonorgestrel (MIRENA) 20 MCG/24HR IUD 1 each by Intrauterine route once.     [provider]  metoCLOPramide (REGLAN) 10 MG tablet Take 1 tablet (10 mg total) by mouth every 6 (six) hours as needed for nausea (or headache). Patient not taking: Reported on 11/12/2014 12/13/13 05/17/19  Dione Booze, MD    Family History Family History  Problem Relation Age of Onset  . Kidney failure Paternal Grandmother   .  Hypertension Father   . Diabetes Mother   . Hypertension Mother   . Lupus Mother   . Kidney failure Mother     Social History Social History   Tobacco Use  . Smoking status: Never Smoker  . Smokeless tobacco: Never Used  Vaping Use  . Vaping Use: Never used  Substance Use Topics  . Alcohol use: Yes    Comment: Social  . Drug use: No     Allergies   Patient has no known allergies.   Review of Systems Review of Systems  As stated above in HPI Physical Exam Triage Vital Signs ED Triage Vitals  Enc Vitals Group     BP 01/09/21 0853 110/60     Pulse Rate 01/09/21 0853 61     Resp 01/09/21 0853 18     Temp 01/09/21 0853 98.3 F (36.8 C)     Temp Source 01/09/21 0853 Oral     SpO2 01/09/21 0853 99 %     Weight --      Height --      Head Circumference --      Peak Flow --      Pain Score 01/09/21 0852 5  Pain Loc --      Pain Edu? --      Excl. in GC? --    No data found.  Updated Vital Signs BP 110/60 (BP Location: Left Arm)   Pulse 61   Temp 98.3 F (36.8 C) (Oral)   Resp 18   SpO2 99%   Physical Exam Vitals and nursing note reviewed.  Constitutional:      General: She is not in acute distress.    Appearance: She is well-developed. She is not ill-appearing, toxic-appearing or diaphoretic.  HENT:     Head: Normocephalic and atraumatic.     Right Ear: Tympanic membrane and ear canal normal.     Left Ear: Tympanic membrane and ear canal normal.     Nose: No congestion or rhinorrhea.     Mouth/Throat:     Pharynx: Uvula midline. Pharyngeal swelling, oropharyngeal exudate, posterior oropharyngeal erythema and uvula swelling present.     Tonsils: Tonsillar exudate present. No tonsillar abscesses. 1+ on the right. 1+ on the left.  Eyes:     Conjunctiva/sclera: Conjunctivae normal.     Pupils: Pupils are equal, round, and reactive to light.  Cardiovascular:     Rate and Rhythm: Normal rate and regular rhythm.     Heart sounds: Normal heart sounds.   Pulmonary:     Breath sounds: Normal breath sounds.  Musculoskeletal:     Cervical back: Neck supple.  Lymphadenopathy:     Cervical: Cervical adenopathy present.  Skin:    Findings: No rash.  Neurological:     Mental Status: She is alert and oriented to person, place, and time.      UC Treatments / Results  Labs (all labs ordered are listed, but only abnormal results are displayed) Labs Reviewed - No data to display  EKG   Radiology No results found.  Procedures Procedures (including critical care time)  Medications Ordered in UC Medications - No data to display  Initial Impression / Assessment and Plan / UC Course  I have reviewed the triage vital signs and the nursing notes.  Pertinent labs & imaging results that were available during my care of the patient were reviewed by me and considered in my medical decision making (see chart for details).     New. She is resting comfortably in office with good visualization of airway. Swabbing for strep and GCC/Trich. Will start on Amoxil given the current outbreak and given symptoms to prevent systemic illness. Discussed how to use along with common potential side effects. Due to her uvula swelling will also start on prednisone. Discussed. Red flag signs and symptoms.    Final Clinical Impressions(s) / UC Diagnoses   Final diagnoses:  None   Discharge Instructions   None    ED Prescriptions    None     PDMP not reviewed this encounter.   Rushie Chestnut, PA-C 01/09/21 1005    Rushie Chestnut, Cordelia Poche 01/09/21 2012

## 2021-01-12 LAB — CYTOLOGY, (ORAL, ANAL, URETHRAL) ANCILLARY ONLY
Chlamydia: NEGATIVE
Comment: NEGATIVE
Comment: NEGATIVE
Comment: NORMAL
Neisseria Gonorrhea: NEGATIVE
Trichomonas: NEGATIVE

## 2021-10-30 ENCOUNTER — Ambulatory Visit (INDEPENDENT_AMBULATORY_CARE_PROVIDER_SITE_OTHER): Payer: Managed Care, Other (non HMO) | Admitting: Registered Nurse

## 2021-10-30 ENCOUNTER — Encounter: Payer: Self-pay | Admitting: Registered Nurse

## 2021-10-30 VITALS — BP 106/72 | HR 76 | Temp 98.2°F | Resp 16 | Ht 66.0 in | Wt 157.2 lb

## 2021-10-30 DIAGNOSIS — Z23 Encounter for immunization: Secondary | ICD-10-CM | POA: Diagnosis not present

## 2021-10-30 DIAGNOSIS — Z13220 Encounter for screening for lipoid disorders: Secondary | ICD-10-CM

## 2021-10-30 DIAGNOSIS — Z0000 Encounter for general adult medical examination without abnormal findings: Secondary | ICD-10-CM

## 2021-10-30 DIAGNOSIS — Z13228 Encounter for screening for other metabolic disorders: Secondary | ICD-10-CM

## 2021-10-30 DIAGNOSIS — Z1329 Encounter for screening for other suspected endocrine disorder: Secondary | ICD-10-CM

## 2021-10-30 DIAGNOSIS — M25551 Pain in right hip: Secondary | ICD-10-CM

## 2021-10-30 DIAGNOSIS — G8929 Other chronic pain: Secondary | ICD-10-CM

## 2021-10-30 LAB — LIPID PANEL
Cholesterol: 161 mg/dL (ref 0–200)
HDL: 65 mg/dL (ref >39.00)
LDL Cholesterol: 87 mg/dL (ref 0–99)
NonHDL: 96.11
Total CHOL/HDL Ratio: 2
Triglycerides: 45 mg/dL (ref 0.0–149.0)
VLDL: 9 mg/dL (ref 0.0–40.0)

## 2021-10-30 LAB — COMPREHENSIVE METABOLIC PANEL
ALT: 11 U/L (ref 0–35)
AST: 17 U/L (ref 0–37)
Albumin: 4.8 g/dL (ref 3.5–5.2)
Alkaline Phosphatase: 50 U/L (ref 39–117)
BUN: 17 mg/dL (ref 6–23)
CO2: 29 mEq/L (ref 19–32)
Calcium: 9.8 mg/dL (ref 8.4–10.5)
Chloride: 102 mEq/L (ref 96–112)
Creatinine, Ser: 0.84 mg/dL (ref 0.40–1.20)
GFR: 89.09 mL/min (ref >60.00)
Glucose, Bld: 81 mg/dL (ref 70–99)
Potassium: 3.6 mEq/L (ref 3.5–5.1)
Sodium: 141 mEq/L (ref 135–145)
Total Bilirubin: 2 mg/dL — ABNORMAL HIGH (ref 0.2–1.2)
Total Protein: 8 g/dL (ref 6.0–8.3)

## 2021-10-30 LAB — CBC WITH DIFFERENTIAL/PLATELET
Basophils Absolute: 0 10*3/uL (ref 0.0–0.1)
Basophils Relative: 0.5 % (ref 0.0–3.0)
Eosinophils Absolute: 0 10*3/uL (ref 0.0–0.7)
Eosinophils Relative: 1.1 % (ref 0.0–5.0)
HCT: 41.8 % (ref 36.0–46.0)
Hemoglobin: 13.6 g/dL (ref 12.0–15.0)
Lymphocytes Relative: 47.6 % — ABNORMAL HIGH (ref 12.0–46.0)
Lymphs Abs: 1.6 10*3/uL (ref 0.7–4.0)
MCHC: 32.5 g/dL (ref 30.0–36.0)
MCV: 90.2 fl (ref 78.0–100.0)
Monocytes Absolute: 0.3 10*3/uL (ref 0.1–1.0)
Monocytes Relative: 9.6 % (ref 3.0–12.0)
Neutro Abs: 1.4 10*3/uL (ref 1.4–7.7)
Neutrophils Relative %: 41.2 % — ABNORMAL LOW (ref 43.0–77.0)
Platelets: 189 10*3/uL (ref 150.0–400.0)
RBC: 4.64 Mil/uL (ref 3.87–5.11)
RDW: 14 % (ref 11.5–15.5)
WBC: 3.4 10*3/uL — ABNORMAL LOW (ref 4.0–10.5)

## 2021-10-30 LAB — TSH: TSH: 2.95 u[IU]/mL (ref 0.35–5.50)

## 2021-10-30 LAB — HEMOGLOBIN A1C: Hgb A1c MFr Bld: 5.7 % (ref 4.6–6.5)

## 2021-10-30 MED ORDER — METHOCARBAMOL 500 MG PO TABS: 500.0000 mg | ORAL_TABLET | Freq: Three times a day (TID) | ORAL | 0 refills | Status: DC | PRN

## 2021-10-30 NOTE — Progress Notes (Signed)
Established Patient Office Visit  Subjective:  Patient ID: Courtney Arellano, female    DOB: Jun 05, 1986  Age: 36 y.o. MRN: [id number]  CC:  Chief Complaint  Patient presents with   Annual Exam    HPI Courtney Arellano presents for CPE  Notes hip pain Worse with certain movements, notes it during yoga practice.  R hip. Onset after childbirth. Feels like nerve pinching.  No hx of injury or trauma to joint. Notes it is achier after activity, but stretching does relieve it somewhat.   Otherwise no concerns  Due for flu shot, would like today.  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    Comment: mirena inserted 08-2019-1st intercourse 36 yo-More than 5 partners  Other Topics Concern   Not on file  Social History Narrative   Right handed   One story   Drinks caffeine 2 cups daily   Social Determinants of Health   Financial Resource Strain: Not on file  Food Insecurity: Not on file  Transportation Needs: Not on file  Physical Activity: Not on file  Stress: Not on file  Social Connections: Not on file  Intimate Partner Violence: Not on file     Outpatient Medications Prior to Visit  Medication Sig Dispense Refill   amoxicillin (AMOXIL) 500 MG capsule Take 1 capsule (500 mg total) by mouth 3 (three) times daily. 21 capsule 0   levonorgestrel (MIRENA) 20 MCG/24HR IUD 1 each by Intrauterine route once.      No facility-administered medications prior to visit.    No Known Allergies  ROS Review of Systems  Constitutional: Negative.   HENT: Negative.    Eyes: Negative.   Respiratory: Negative.    Cardiovascular: Negative.   Gastrointestinal: Negative.   Genitourinary: Negative.   Musculoskeletal:  Positive for arthralgias.  Skin: Negative.   Neurological: Negative.   Psychiatric/Behavioral: Negative.    All other systems reviewed and are negative.    Objective:    Physical Exam Vitals and nursing note reviewed.  Constitutional:      General: She is not in acute distress.    Appearance: Normal appearance. She is normal weight. She is not ill-appearing, toxic-appearing or diaphoretic.  HENT:     Head: Normocephalic and atraumatic.     Right Ear: Tympanic membrane, ear canal and external ear normal. There is no impacted cerumen.     Left Ear: Tympanic membrane, ear canal and external ear normal. There is no impacted cerumen.  Nose: Nose normal. No congestion or rhinorrhea.     Mouth/Throat:     Mouth: Mucous membranes are moist.     Pharynx: Oropharynx is clear. No oropharyngeal exudate or posterior oropharyngeal erythema.  Eyes:     General: No scleral icterus.       Right eye: No discharge.        Left eye: No discharge.     Extraocular Movements: Extraocular movements intact.     Conjunctiva/sclera: Conjunctivae normal.     Pupils: Pupils are equal, round, and reactive to light.  Cardiovascular:     Rate and Rhythm: Normal rate and regular rhythm.     Pulses: Normal pulses.     Heart sounds: Normal heart sounds. No murmur heard.   No friction rub. No gallop.  Pulmonary:     Effort: Pulmonary effort  is normal. No respiratory distress.     Breath sounds: Normal breath sounds. No stridor. No wheezing, rhonchi or rales.  Chest:     Chest wall: No tenderness.  Abdominal:     General: Abdomen is flat. Bowel sounds are normal. There is no distension.     Palpations: Abdomen is soft. There is no mass.     Tenderness: There is no abdominal tenderness. There is no right CVA tenderness, left CVA tenderness, guarding or rebound.     Hernia: No hernia is present.  Musculoskeletal:        General: No swelling, tenderness, deformity or signs of injury. Normal range of motion.     Right lower leg: No edema.     Left lower leg: No edema.     Comments: Pain with flexion of R hip  Skin:    General: Skin is warm and dry.     Capillary Refill: Capillary refill takes less than 2 seconds.     Coloration: Skin is not jaundiced or pale.     Findings: No bruising, erythema, lesion or rash.  Neurological:     General: No focal deficit present.     Mental Status: She is alert and oriented to person, place, and time. Mental status is at baseline.     Cranial Nerves: No cranial nerve deficit.     Sensory: No sensory deficit.     Motor: No weakness.     Coordination: Coordination normal.     Gait: Gait normal.     Deep Tendon Reflexes: Reflexes normal.  Psychiatric:        Mood and Affect: Mood normal.        Behavior: Behavior normal.        Thought Content: Thought content normal.        Judgment: Judgment normal.    There were no vitals taken for this visit. Wt Readings from Last 3 Encounters:  07/21/20 150 lb (68 kg)  06/12/20 127 lb (57.6 kg)  05/06/20 168 lb 12.8 oz (76.6 kg)     Health Maintenance Due  Topic Date Due   COVID-19 Vaccine (1) Never done   INFLUENZA VACCINE  05/18/2021    There are no preventive care reminders to display for this patient.  Lab Results  Component Value Date   TSH 1.500 05/02/2020   Lab Results  Component Value Date   WBC 3.6 (L) 07/21/2020   HGB  13.8 07/21/2020   HCT 42.1 07/21/2020   MCV 90.9 07/21/2020   PLT 213 07/21/2020   Lab Results  Component Value Date   NA 141 05/30/2020   K 3.6  05/30/2020   CO2 26 05/30/2020   GLUCOSE 106 (H) 05/30/2020   BUN 14 05/30/2020   CREATININE 0.94 05/30/2020   BILITOT 1.4 (H) 05/30/2020   ALKPHOS 60 05/30/2020   AST 23 05/30/2020   ALT 17 05/30/2020   PROT 7.7 05/30/2020   ALBUMIN 4.8 05/30/2020   CALCIUM 9.5 05/30/2020   Lab Results  Component Value Date   CHOL 154 05/30/2020   Lab Results  Component Value Date   HDL 55 05/30/2020   Lab Results  Component Value Date   LDLCALC 89 05/30/2020   Lab Results  Component Value Date   TRIG 47 05/30/2020   Lab Results  Component Value Date   CHOLHDL 2.8 05/30/2020   Lab Results  Component Value Date   HGBA1C 5.6 05/02/2020      Assessment & Plan:   Problem List Items Addressed This Visit   None   No orders of the defined types were placed in this encounter.   Follow-up: No follow-ups on file.   PLAN  Janeece Agee, NP

## 2021-10-30 NOTE — Addendum Note (Signed)
Addended by: Hildred Priest on: 10/30/2021 08:47 AM   Modules accepted: Orders

## 2021-10-30 NOTE — Patient Instructions (Signed)
Ms. Dafney Recendez to see you!  Let's get this hip figured out. Xrays at [place] they take walk ins  Take methocarbamol as directed as needed and tylenol as needed.    Otherwise, exam normal.  Checking on labs, I'll call if anything is of concern.  Let me know if pain gets worse or doesn't go away. I'll let you know what I think based on xrays.  See you in a year for a physical!  Thanks,  Denice Paradise

## 2021-11-05 ENCOUNTER — Other Ambulatory Visit: Payer: Self-pay

## 2021-11-05 ENCOUNTER — Ambulatory Visit
Admission: RE | Admit: 2021-11-05 | Discharge: 2021-11-05 | Disposition: A | Discharge status: Home / Self Care | Payer: Managed Care, Other (non HMO) | Source: Ambulatory Visit | Attending: Registered Nurse | Admitting: Registered Nurse

## 2021-11-05 DIAGNOSIS — G8929 Other chronic pain: Secondary | ICD-10-CM

## 2021-12-25 ENCOUNTER — Telehealth: Payer: Self-pay | Admitting: Registered Nurse

## 2021-12-25 ENCOUNTER — Telehealth (INDEPENDENT_AMBULATORY_CARE_PROVIDER_SITE_OTHER): Payer: Managed Care, Other (non HMO) | Admitting: Registered Nurse

## 2021-12-25 DIAGNOSIS — G479 Sleep disorder, unspecified: Secondary | ICD-10-CM | POA: Diagnosis not present

## 2021-12-25 DIAGNOSIS — L700 Acne vulgaris: Secondary | ICD-10-CM

## 2021-12-25 MED ORDER — TRAZODONE HCL 50 MG PO TABS: 25.0000 mg | ORAL_TABLET | Freq: Every evening | ORAL | 3 refills | Status: DC | PRN

## 2021-12-25 MED ORDER — CLINDAMYCIN-TRETINOIN 1.2-0.025 % EX GEL: Freq: Every day | CUTANEOUS | 2 refills | Status: DC

## 2021-12-25 NOTE — Progress Notes (Signed)
Telemedicine Encounter- SOAP NOTE Established Patient  This video encounter was conducted with the patient's (or proxy's) verbal consent via audio telecommunications: yes/no: Yes Patient was instructed to have this encounter in a suitably private space; and to only have persons present to whom they give permission to participate. In addition, patient identity was confirmed by use of name plus two identifiers (DOB and address).  I discussed the limitations, risks, security and privacy concerns of performing an evaluation and management service by telephone and the availability of in person appointments. I also discussed with the patient that there may be a patient responsible charge related to this service. The patient expressed understanding and agreed to proceed.  I spent a total of 15 minutes talking with the patient or their proxy.  Patient at home Provider in office  Participants: Jari Sportsman, NP and Christene Slates  No chief complaint on file.   Subjective   Courtney Arellano is a 36 y.o. established patient. Video visit today for sleep, retinol  HPI Sleep Admits imperfect sleep hygiene but only getting 3-4 hours of sleep each night.  Has tried OTC melatonin and unisom, limited relief, and has AE of grogginess and headaches next day.  Has not taken rx sleep aid in the past.   Acne Vulgaris Ongoing. Has used tretinoin-clindamycin with good effect in past Would like to have rx through our office. Had been through online prescriber in the past.    Patient Active Problem List   Diagnosis Date Noted   Leukopenia 07/21/2020   Family history of lupus erythematosus 07/21/2020   Elevated bilirubin 07/21/2020   Candidiasis of vagina 08/07/2019   Chronic migraine without aura without status migrainosus, not intractable 07/10/2019   Migraine 05/17/2017   IUD contraception 08/27/2014    Past Medical History:  Diagnosis Date   IUD (intrauterine device) in place    Migraine      Current Outpatient Medications  Medication Sig Dispense Refill   clindamycin-tretinoin (ZIANA) gel Apply topically at bedtime. 30 g 2   traZODone (DESYREL) 50 MG tablet Take 0.5-1 tablets (25-50 mg total) by mouth at bedtime as needed for sleep. 30 tablet 3   levonorgestrel (MIRENA) 20 MCG/24HR IUD 1 each by Intrauterine route once.  (Patient not taking: Reported on 10/30/2021)     methocarbamol (ROBAXIN) 500 MG tablet Take 1 tablet (500 mg total) by mouth every 8 (eight) hours as needed for muscle spasms. 30 tablet 0   No current facility-administered medications for this visit.    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    Comment: mirena inserted 08-2019-1st intercourse 36 yo-More than 5 partners  Other Topics Concern   Not on file  Social History Narrative   Right handed   One story   Drinks caffeine 2 cups daily   Social Determinants of Health   Financial Resource Strain: Not on file  Food Insecurity: Not on  file  Transportation Needs: Not on file  Physical Activity: Not on file  Stress: Not on file  Social Connections: Not on file  Intimate Partner Violence: Not on file    ROS Per hpi   Objective   Vitals as reported by the patient: There were no vitals filed for this visit.  Diagnoses and all orders for this visit:  Sleep disturbance -     traZODone (DESYREL) 50 MG tablet; Take 0.5-1 tablets (25-50 mg total) by mouth at bedtime as needed for sleep.  Acne vulgaris -      clindamycin-tretinoin (ZIANA) gel; Apply topically at bedtime.    PLAN Trazodone 25-50mg  po qhs prn for sleep. Sending topical tretinoin. Reviewed risks, benefits, alternatives. Pt voices understanding Patient encouraged to call clinic with any questions, comments, or concerns.  I discussed the assessment and treatment plan with the patient. The patient was provided an opportunity to ask questions and all were answered. The patient agreed with the plan and demonstrated an understanding of the instructions.   The patient was advised to call back or seek an in-person evaluation if the symptoms worsen or if the condition fails to improve as anticipated.  I provided 15 minutes of non-face-to-face time during this encounter.  Janeece Agee, NP

## 2022-01-15 ENCOUNTER — Encounter: Payer: Self-pay | Admitting: Registered Nurse

## 2022-03-03 ENCOUNTER — Ambulatory Visit: Payer: Managed Care, Other (non HMO) | Admitting: Radiology

## 2022-03-23 ENCOUNTER — Encounter: Payer: Self-pay | Admitting: Radiology

## 2022-03-23 ENCOUNTER — Ambulatory Visit (INDEPENDENT_AMBULATORY_CARE_PROVIDER_SITE_OTHER): Payer: Managed Care, Other (non HMO) | Admitting: Radiology

## 2022-03-23 ENCOUNTER — Other Ambulatory Visit (HOSPITAL_COMMUNITY)
Admission: RE | Admit: 2022-03-23 | Discharge: 2022-03-23 | Disposition: A | Discharge status: Home / Self Care | Payer: Managed Care, Other (non HMO) | Source: Ambulatory Visit | Attending: Radiology | Admitting: Radiology

## 2022-03-23 VITALS — BP 102/70 | Ht 67.0 in | Wt 156.0 lb

## 2022-03-23 DIAGNOSIS — Z01419 Encounter for gynecological examination (general) (routine) without abnormal findings: Secondary | ICD-10-CM

## 2022-03-23 DIAGNOSIS — Z113 Encounter for screening for infections with a predominantly sexual mode of transmission: Secondary | ICD-10-CM | POA: Diagnosis not present

## 2022-03-23 DIAGNOSIS — Z30431 Encounter for routine checking of intrauterine contraceptive device: Secondary | ICD-10-CM | POA: Diagnosis not present

## 2022-03-23 NOTE — Progress Notes (Signed)
   Courtney Arellano 1985-12-11 [id number]   History:  36 y.o. G1P1 presents for annual exam. Would like STI screen, has had new partners in the past year, no symptoms. No other gyn concerns, does have a PCP.  Gynecologic History No LMP recorded. (Menstrual status: IUD).   Contraception/Family planning: IUD inserted 08/2019 Sexually active: yes Last Pap: 2019. Results were: normal   Obstetric History OB History  Gravida Para Term Preterm AB Living  1 1 1     1   SAB IAB Ectopic Multiple Live Births               # Outcome Date GA Lbr Len/2nd Weight Sex Delivery Anes PTL Lv  1 Term              The following portions of the patient's history were reviewed and updated as appropriate: allergies, current medications, past family history, past medical history, past social history, past surgical history, and problem list.  Review of Systems Pertinent items noted in HPI and remainder of comprehensive ROS otherwise negative.   Past medical history, past surgical history, family history and social history were all reviewed and documented in the EPIC chart.   Exam:  Vitals:   03/23/22 1142  BP: 102/70  Weight: 156 lb (70.8 kg)  Height: 5\' 7"  (1.702 m)   Body mass index is 24.43 kg/m.  General appearance:  Normal Thyroid:  Symmetrical, normal in size, without palpable masses or nodularity. Respiratory  Auscultation:  Clear without wheezing or rhonchi Cardiovascular  Auscultation:  Regular rate, without rubs, murmurs or gallops  Edema/varicosities:  Not grossly evident Abdominal  Soft,nontender, without masses, guarding or rebound.  Liver/spleen:  No organomegaly noted  Hernia:  None appreciated  Skin  Inspection:  Grossly normal Breasts: Examined lying and sitting.   Right: Without masses, retractions, nipple discharge or axillary adenopathy.   Left: Without masses, retractions, nipple discharge or axillary adenopathy. Genitourinary   Inguinal/mons:  Normal without  inguinal adenopathy  External genitalia:  Normal appearing vulva with no masses, tenderness, or lesions  BUS/Urethra/Skene's glands:  Normal without masses or exudate  Vagina:  Normal appearing with normal color and discharge, no lesions  Cervix:  Normal appearing without discharge or lesions  Uterus:  Normal in size, shape and contour.  Mobile, nontender  Adnexa/parametria:     Rt: Normal in size, without masses or tenderness.   Lt: Normal in size, without masses or tenderness.  Anus and perineum: Normal   Patient informed chaperone available to be present for breast and pelvic exam. Patient has requested no chaperone to be present. Patient has been advised what will be completed during breast and pelvic exam.   Assessment/Plan:   1. Well woman exam with routine gynecological exam Pap with cotesting  2. Screening for STDs (sexually transmitted diseases)  - Cytology - PAP( [place]) - HIV antibody (with reflex) - RPR - Hepatitis C antibody  3. IUD check up   Reassured may leave Mirena until 08/2027   Discussed SBE, mammo, pap, STI screening as directed/appropriate. Recommend of exercise weekly, including weight bearing exercise. Encouraged the use of seatbelts and sunscreen. Return in 1 year for annual or as needed.   09/2027 B WHNP-BC 11:54 AM 03/23/2022

## 2022-03-24 LAB — HEPATITIS C ANTIBODY
Hepatitis C Ab: NONREACTIVE
SIGNAL TO CUT-OFF: 0.14 (ref <1.00)

## 2022-03-24 LAB — HIV ANTIBODY (ROUTINE TESTING W REFLEX): HIV 1&2 Ab, 4th Generation: NONREACTIVE

## 2022-03-24 LAB — RPR: RPR Ser Ql: NONREACTIVE

## 2022-03-25 LAB — CYTOLOGY - PAP
Adequacy: ABSENT
Chlamydia: NEGATIVE
Comment: NEGATIVE
Comment: NEGATIVE
Comment: NEGATIVE
Comment: NORMAL
Diagnosis: NEGATIVE
High risk HPV: NEGATIVE
Neisseria Gonorrhea: NEGATIVE
Trichomonas: NEGATIVE

## 2022-04-14 IMAGING — CR DG HIP (WITH OR WITHOUT PELVIS) 2-3V*R*
2 series · 2 of 2 positions shown · non-contrast
Comparison: None.

CLINICAL DATA: Chronic right hip pain.

EXAM:
DG HIP (WITH OR WITHOUT PELVIS) 2-3V RIGHT

[w pelvis upright]
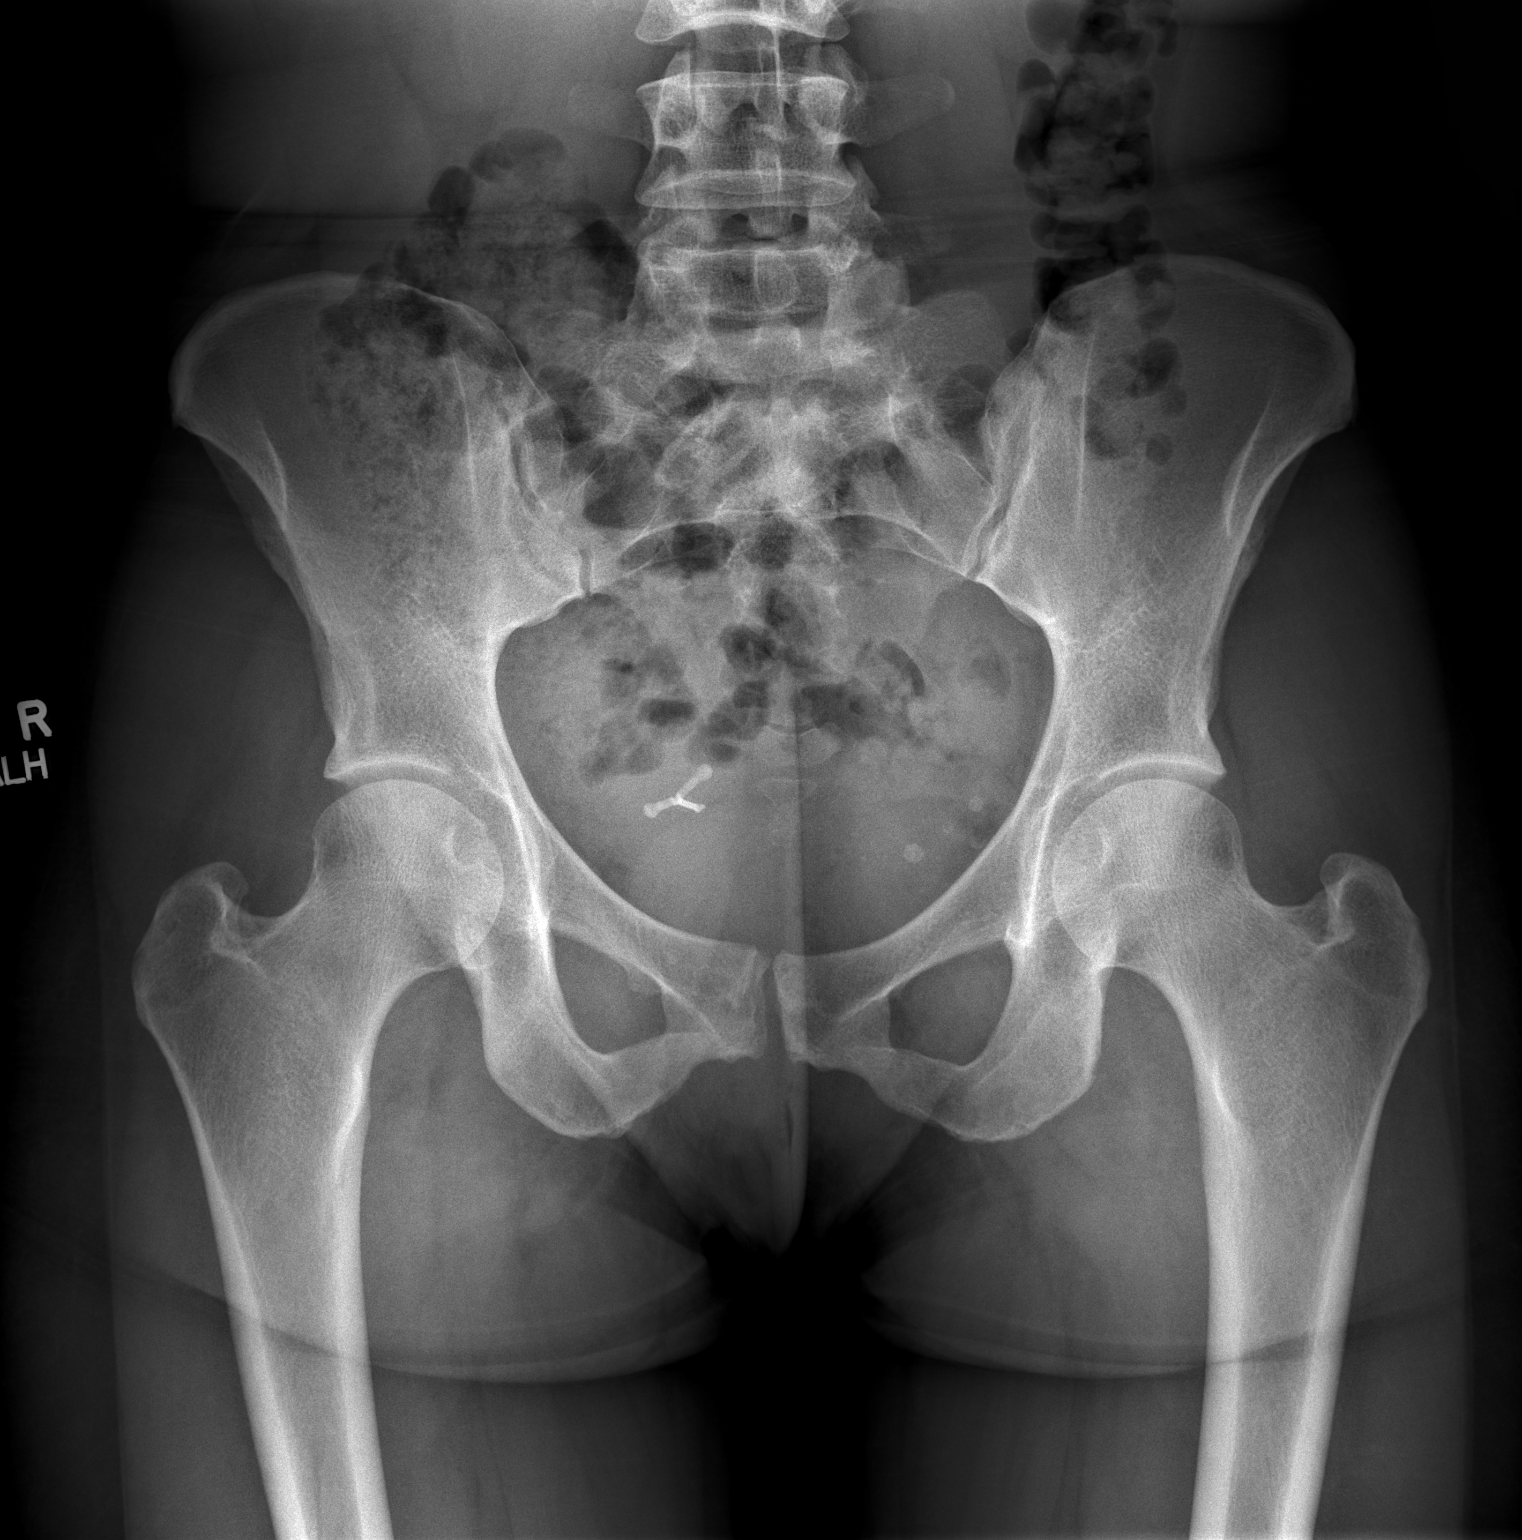

[w hip frog right]
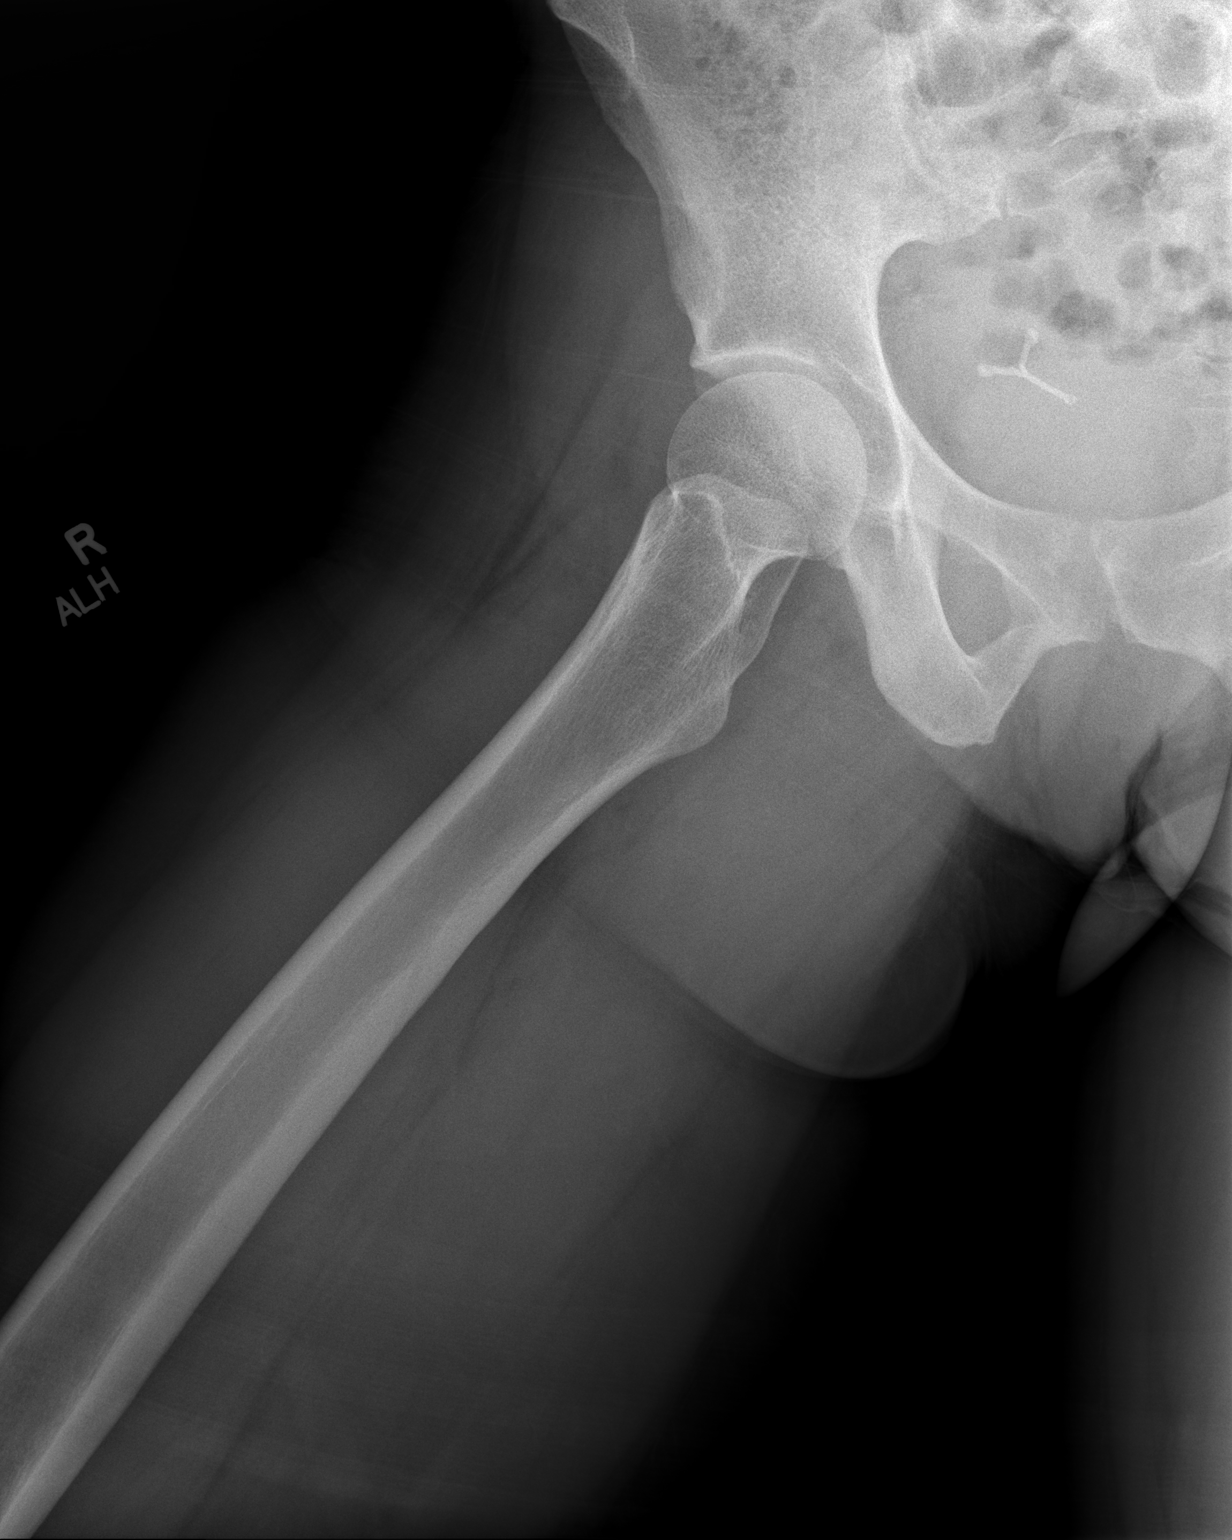

[2 of 2 positions shown; findings below may reference images not displayed]

FINDINGS: Normal bone mineralization. An IUD overlies the pelvis, slight to
the right of midline. Vascular phleboliths overlie the left
hemipelvis. The bilateral femoroacetabular and sacroiliac joint
spaces are maintained. Minimal superior pubic symphysis degenerative
osteophytosis. No acute fracture or dislocation.
IMPRESSION: No significant degenerative change of the right hip.

## 2022-06-14 ENCOUNTER — Ambulatory Visit
Admission: RE | Admit: 2022-06-14 | Discharge: 2022-06-14 | Disposition: A | Discharge status: Home / Self Care | Payer: Managed Care, Other (non HMO) | Source: Ambulatory Visit | Attending: Urgent Care | Admitting: Urgent Care

## 2022-06-14 VITALS — BP 118/76 | HR 56 | Temp 98.6°F | Resp 16

## 2022-06-14 DIAGNOSIS — Z7251 High risk heterosexual behavior: Secondary | ICD-10-CM | POA: Diagnosis present

## 2022-06-14 NOTE — ED Provider Notes (Signed)
  [facility]   MRN: [id number] DOB: 1986/10/13  Subjective:   Courtney Arellano is a 36 y.o. female presenting for vaginal swab for gonorrhea, chlamydia and trichomonas.  Patient had sex without condom use with a new partner 2 weeks ago. Denies fever, n/v, abdominal pain, pelvic pain, rashes, dysuria, urinary frequency, hematuria, vaginal discharge.  Does not want blood work done today.   No current facility-administered medications for this encounter.  Current Outpatient Medications:    clindamycin-tretinoin (ZIANA) gel, Apply topically at bedtime., Disp: 30 g, Rfl: 2   levonorgestrel (MIRENA) 20 MCG/24HR IUD, 1 each by Intrauterine route once., Disp: , Rfl:    methocarbamol (ROBAXIN) 500 MG tablet, Take 1 tablet (500 mg total) by mouth every 8 (eight) hours as needed for muscle spasms. (Patient not taking: Reported on 03/23/2022), Disp: 30 tablet, Rfl: 0   traZODone (DESYREL) 50 MG tablet, Take 0.5-1 tablets (25-50 mg total) by mouth at bedtime as needed for sleep., Disp: 30 tablet, Rfl: 3   No Known Allergies  Past Medical History:  Diagnosis Date   IUD (intrauterine device) in place    Migraine      Past Surgical History:  Procedure Laterality Date   ROOT CANAL Right     Family History  Problem Relation Age of Onset   Kidney failure Paternal Grandmother    Hypertension Father    Diabetes Mother    Hypertension Mother    Lupus Mother    Kidney failure Mother     Social History   Tobacco Use   Smoking status: Never   Smokeless tobacco: Never  Vaping Use   Vaping Use: Never used  Substance Use Topics   Alcohol use: Yes    Comment: Social   Drug use: No    ROS   Objective:   Vitals: BP 118/76   Pulse (!) 56   Temp 98.6 F (37 C)   Resp 16   SpO2 100%   Physical Exam Constitutional:      General: She is not in acute distress.    Appearance: Normal appearance. She is well-developed. She is not ill-appearing, toxic-appearing or  diaphoretic.  HENT:     Head: Normocephalic and atraumatic.     Nose: Nose normal.     Mouth/Throat:     Mouth: Mucous membranes are moist.  Eyes:     General: No scleral icterus.       Right eye: No discharge.        Left eye: No discharge.     Extraocular Movements: Extraocular movements intact.  Cardiovascular:     Rate and Rhythm: Normal rate.  Pulmonary:     Effort: Pulmonary effort is normal.  Skin:    General: Skin is warm and dry.  Neurological:     General: No focal deficit present.     Mental Status: She is alert and oriented to person, place, and time.  Psychiatric:        Mood and Affect: Mood normal.        Behavior: Behavior normal.     Assessment and Plan :   PDMP not reviewed this encounter.  1. Unprotected sex    We will treat as appropriate based off of lab results.   Wallis Bamberg, [place] 06/14/22 1554

## 2022-06-14 NOTE — ED Triage Notes (Signed)
Pt here for routine STI check due to having unprotected sex with a new partner. NO bloodwork today.

## 2022-06-15 LAB — CERVICOVAGINAL ANCILLARY ONLY
Chlamydia: NEGATIVE
Comment: NEGATIVE
Comment: NEGATIVE
Comment: NORMAL
Neisseria Gonorrhea: NEGATIVE
Trichomonas: NEGATIVE

## 2022-08-20 ENCOUNTER — Encounter: Payer: Self-pay | Admitting: Family Medicine

## 2022-08-20 ENCOUNTER — Ambulatory Visit: Payer: Managed Care, Other (non HMO) | Admitting: Family Medicine

## 2022-08-20 VITALS — BP 96/70 | HR 62 | Temp 97.3°F | Ht 67.0 in | Wt 161.0 lb

## 2022-08-20 DIAGNOSIS — Z7689 Persons encountering health services in other specified circumstances: Secondary | ICD-10-CM | POA: Diagnosis not present

## 2022-08-20 DIAGNOSIS — G479 Sleep disorder, unspecified: Secondary | ICD-10-CM

## 2022-08-20 DIAGNOSIS — M25551 Pain in right hip: Secondary | ICD-10-CM

## 2022-08-20 DIAGNOSIS — G8929 Other chronic pain: Secondary | ICD-10-CM | POA: Diagnosis not present

## 2022-08-20 NOTE — Progress Notes (Unsigned)
New Patient Office Visit  Subjective    Patient ID: Courtney Arellano, female    DOB: October 29, 1985  Age: 36 y.o. MRN: [id number]  CC:  Chief Complaint  Patient presents with   Transitions Of Care    Discuss medication changes for hip pain and sleep   Also hair loss medication     HPI Courtney Arellano presents to establish care OB/GYN   Chronic right hip pain x 2 1/2 years.  X rays negative.  She does yoga and the pain is worse with certain movements.  Pain is becoming more constant. Feels like a pinching.  No PT yet.   Having issues falling asleep and staying asleep.  Trazodone makes her to drowsy in the morning.  Melatonin 5mg   and 10mg  makes her too sleepy the next morning.   She has migraine headaches.    , desk job.    Outpatient Encounter Medications as of 08/20/2022  Medication Sig   clindamycin-tretinoin (ZIANA) gel Apply topically at bedtime.   levonorgestrel (MIRENA) 20 MCG/24HR IUD 1 each by Intrauterine route once.   traZODone (DESYREL) 50 MG tablet Take 0.5-1 tablets (25-50 mg total) by mouth at bedtime as needed for sleep.   [DISCONTINUED] methocarbamol (ROBAXIN) 500 MG tablet Take 1 tablet (500 mg total) by mouth every 8 (eight) hours as needed for muscle spasms. (Patient not taking: Reported on 03/23/2022)   [DISCONTINUED] metoCLOPramide (REGLAN) 10 MG tablet Take 1 tablet (10 mg total) by mouth every 6 (six) hours as needed for nausea (or headache). (Patient not taking: Reported on 11/12/2014)   No facility-administered encounter medications on file as of 08/20/2022.    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    Comment: mirena inserted 08-2019-1st intercourse 36 yo-More than 5 partners  Other Topics Concern   Not on file  Social History Narrative   Right handed   One story   Drinks caffeine 2 cups daily   Social Determinants of Health   Financial Resource Strain: Low Risk  (07/10/2019)   Overall Financial Resource Strain (CARDIA)    Difficulty of Paying Living Expenses: Not hard at all  Food Insecurity: No Food Insecurity (07/10/2019)   Hunger Vital Sign    Worried About Running Out of Food in the Last Year: Never true    Ran Out of Food in the Last Year: Never true  Transportation Needs: No Transportation Needs (07/10/2019)   PRAPARE -  Hydrologist (Medical): No    Lack of Transportation (Non-Medical): No  Physical Activity: Not on file  Stress: Not on file  Social Connections: Not on file  Intimate Partner Violence: Not on file    ROS      Objective    BP 96/70 (BP Location: Left Arm, Patient Position: Sitting, Cuff Size: Large)   Pulse 62   Temp (!) 97.3 F (36.3 C) (Temporal)   Ht 5\' 7"  (1.702 m)   Wt 161 lb (73 kg)   SpO2 96%   BMI 25.22 kg/m   Physical Exam  {Labs (Optional):23779}    Assessment & Plan:   Problem List Items Addressed This Visit       Other   Chronic right hip pain - Primary   Relevant Orders   Ambulatory referral to Orthopedic Surgery   Sleep disturbances   Other Visit Diagnoses      Encounter to establish care           Return in about 4 weeks (around 09/17/2022).   Harland Dingwall, NP-C

## 2022-08-20 NOTE — Patient Instructions (Signed)
Thank you for trusting [place] with your health care.  Try taking melatonin 3 mg.  Let me know if you cannot find this over-the-counter.  Develop a good nighttime routine that works for you.  You should hear from Ortho care in the next week to schedule a visit.  Try using ice, a topical pain medicine over-the-counter and Tylenol as needed.

## 2022-08-22 NOTE — Assessment & Plan Note (Signed)
Referral to orthopedic surgery

## 2022-08-22 NOTE — Assessment & Plan Note (Signed)
In depth discuss regarding patient sleep issues and previous therapies. Counseling on good sleep hygiene. Recommend trying melatonin 3 mg. Discussed getting into a routine to help with sleep.  Reviewed labs from earlier this year with patient. Normal TSH

## 2022-09-02 ENCOUNTER — Ambulatory Visit: Payer: Managed Care, Other (non HMO)

## 2022-09-02 ENCOUNTER — Ambulatory Visit
Admission: EM | Admit: 2022-09-02 | Discharge: 2022-09-02 | Disposition: A | Discharge status: Home / Self Care | Payer: Managed Care, Other (non HMO) | Attending: Urgent Care | Admitting: Urgent Care

## 2022-09-02 DIAGNOSIS — Z113 Encounter for screening for infections with a predominantly sexual mode of transmission: Secondary | ICD-10-CM | POA: Diagnosis not present

## 2022-09-02 DIAGNOSIS — L500 Allergic urticaria: Secondary | ICD-10-CM | POA: Diagnosis not present

## 2022-09-02 MED ORDER — HYDROXYZINE HCL 25 MG PO TABS: 12.5000 mg | ORAL_TABLET | Freq: Three times a day (TID) | ORAL | 0 refills | Status: DC | PRN

## 2022-09-02 MED ORDER — PREDNISONE 20 MG PO TABS: ORAL_TABLET | ORAL | 0 refills | Status: DC

## 2022-09-02 NOTE — ED Provider Notes (Signed)
[facility]  Note:  This document was prepared using Conservation officer, historic buildings and may include unintentional dictation errors.  MRN: [id number] DOB: Jul 10, 1986  Subjective:   Courtney Arellano is a 36 y.o. female presenting for 2 concerns.  Past few days she has had intermittent urticaria over her torso, limbs and face. Denies eating any new foods, starting new medications, exposure to poisonous plants, new hygiene products, new cleaning products or detergents.  Her symptoms have been responding to use of Benadryl but patient would like to try a different medication.  Has not previously reacted this way to anything.  Has significant concern about it would like a referral to an allergist especially since there is nothing new in her environment.  She would also like testing specifically for the HSV 1 and HSV 2 antibodies.  Reports that one of her previous sex partners ended up having blood work done and was tested twice for this, according to the patient reports that it was confirmed he had positive HSV antibodies.  She has previously had STI testing and is only coming in for this particular test.  Has never manifested a cold sore or herpes like rash.  No current facility-administered medications for this encounter.  Current Outpatient Medications:    clindamycin-tretinoin (ZIANA) gel, Apply topically at bedtime., Disp: 30 g, Rfl: 2   levonorgestrel (MIRENA) 20 MCG/24HR IUD, 1 each by Intrauterine route once., Disp: , Rfl:    traZODone (DESYREL) 50 MG tablet, Take 0.5-1 tablets (25-50 mg total) by mouth at bedtime as needed for sleep., Disp: 30 tablet, Rfl: 3   No Known Allergies  Past Medical History:  Diagnosis Date   IUD (intrauterine device) in place    Migraine      Past Surgical History:  Procedure Laterality Date   ROOT CANAL Right     Family History  Problem Relation Age of Onset   Diabetes Mother    Hypertension Mother    Lupus Mother     Kidney failure Mother    Hypertension Father    Kidney failure Paternal Grandmother     Social History   Tobacco Use   Smoking status: Never   Smokeless tobacco: Never  Vaping Use   Vaping Use: Never used  Substance Use Topics   Alcohol use: Yes    Comment: Social   Drug use: No    ROS   Objective:   Vitals: BP 114/70 (BP Location: Right Arm)   Pulse 77   Temp 98 F (36.7 C) (Oral)   SpO2 98%   Physical Exam Constitutional:      General: She is not in acute distress.    Appearance: Normal appearance. She is well-developed. She is not ill-appearing, toxic-appearing or diaphoretic.  HENT:     Head: Normocephalic and atraumatic.     Right Ear: External ear normal.     Left Ear: External ear normal.     Nose: Nose normal.     Mouth/Throat:     Mouth: Mucous membranes are moist.  Eyes:     General: No scleral icterus.       Right eye: No discharge.        Left eye: No discharge.     Extraocular Movements: Extraocular movements intact.  Cardiovascular:     Rate and Rhythm: Normal rate.  Pulmonary:     Effort: Pulmonary effort is normal.  Skin:    General: Skin is warm and dry.  Findings: No rash.     Comments: Patient demonstrated pictures of obvious urticarial lesions that she had just earlier today over the face and neck area.  Neurological:     General: No focal deficit present.     Mental Status: She is alert and oriented to person, place, and time.  Psychiatric:        Mood and Affect: Mood normal.        Behavior: Behavior normal.        Thought Content: Thought content normal.        Judgment: Judgment normal.     Assessment and Plan :   PDMP not reviewed this encounter.  1. Allergic urticaria   2. Screen for STD (sexually transmitted disease)     Patient declined steroids for now.  Offered her hydroxyzine.  Emphasized need to monitor for new exposures at home or in her work environment.  I was agreeable with patient and placed a referral  to the allergy testing center.  I discussed extensively the nature of testing for HSV antibodies.  Unfortunately this would not be a conclusive test and as she is asymptomatic and has never had herpes like rash, I advised that we could not determine if she has a herpes simplex infection.  Patient verbalized understanding and still requested the blood work.  Counseled patient on potential for adverse effects with medications prescribed/recommended today, ER and return-to-clinic precautions discussed, patient verbalized understanding.    Wallis Bamberg, PA-C 09/02/22 [id number]

## 2022-09-02 NOTE — ED Triage Notes (Signed)
Request STI screening. Past partner tested positive for Herpes and would like to get tested. No current symptoms.

## 2022-09-02 NOTE — ED Triage Notes (Signed)
Pt presents with c/o of itching/hives since Tuesday. Pt unaware of the source. Pt states she has been itching all over. Has taken benadryl. States rash went away and it then came back this morning.

## 2022-09-03 ENCOUNTER — Encounter: Payer: Self-pay | Admitting: Family Medicine

## 2022-09-03 ENCOUNTER — Ambulatory Visit: Payer: Managed Care, Other (non HMO) | Admitting: Surgery

## 2022-09-03 ENCOUNTER — Ambulatory Visit: Payer: Managed Care, Other (non HMO) | Admitting: Orthopaedic Surgery

## 2022-09-03 ENCOUNTER — Ambulatory Visit: Payer: Managed Care, Other (non HMO) | Admitting: Family Medicine

## 2022-09-03 VITALS — BP 106/74 | HR 75 | Temp 97.8°F | Ht 67.0 in | Wt 170.0 lb

## 2022-09-03 DIAGNOSIS — Z833 Family history of diabetes mellitus: Secondary | ICD-10-CM | POA: Diagnosis not present

## 2022-09-03 DIAGNOSIS — R17 Unspecified jaundice: Secondary | ICD-10-CM

## 2022-09-03 DIAGNOSIS — R7303 Prediabetes: Secondary | ICD-10-CM

## 2022-09-03 LAB — HEMOGLOBIN A1C: Hgb A1c MFr Bld: 5.8 % (ref 4.6–6.5)

## 2022-09-03 NOTE — Progress Notes (Signed)
Subjective:     Patient ID: Courtney Arellano, female    DOB: Jul 11, 1986, 36 y.o.   MRN: [id number]  Chief Complaint  Patient presents with   Follow-up    Would like to f/u on her labs regarding the prediabetes and check on that    HPI Patient is in today for a follow up on prediabetes and elevated bilirubin.   Mother died from complications from diabetes in her early 53s.   Feels fine. No concerns.   There are no preventive care reminders to display for this patient.  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    Comment: mirena inserted 08-2019-1st intercourse 36 yo-More than 5 partners  Other Topics Concern   Not on file  Social History Narrative   Right handed   One story   Drinks caffeine 2 cups daily   Social Determinants of Health   Financial Resource Strain: Low Risk  (07/10/2019)   Overall Financial Resource Strain (CARDIA)    Difficulty of Paying Living Expenses: Not hard at all  Food Insecurity: No Food Insecurity (07/10/2019)   Hunger Vital Sign    Worried About Running Out of Food in the Last Year: Never true     Ran Out of Food in the Last Year: Never true  Transportation Needs: No Transportation Needs (07/10/2019)   PRAPARE - Administrator, Civil Service (Medical): No    Lack of Transportation (Non-Medical): No  Physical Activity: Not on file  Stress: Not on file  Social Connections: Not on file  Intimate Partner Violence: Not on file    Outpatient Medications Prior to Visit  Medication Sig Dispense Refill   clindamycin-tretinoin (ZIANA) gel Apply topically at bedtime. 30 g 2   hydrOXYzine (ATARAX) 25 MG tablet Take 0.5-1 tablets (12.5-25 mg total) by mouth every 8 (eight) hours as needed for itching. 30 tablet 0   levonorgestrel (MIRENA) 20 MCG/24HR IUD 1 each by Intrauterine route once.     predniSONE (DELTASONE) 20 MG tablet Take 2 tablets daily with breakfast. 10 tablet 0   traZODone (DESYREL) 50 MG tablet Take 0.5-1 tablets (25-50 mg total) by mouth at bedtime as needed for sleep. 30 tablet 3   No facility-administered medications prior to visit.    No Known Allergies  ROS     Objective:    Physical Exam Constitutional:      General: She is not in acute distress.  Appearance: She is not ill-appearing.  Cardiovascular:     Rate and Rhythm: Normal rate.  Pulmonary:     Effort: Pulmonary effort is normal.  Neurological:     General: No focal deficit present.     Mental Status: She is alert and oriented to person, place, and time.  Psychiatric:        Mood and Affect: Mood normal.        Behavior: Behavior normal.     BP 106/74 (BP Location: Left Arm, Patient Position: Sitting, Cuff Size: Large)   Pulse 75   Temp 97.8 F (36.6 C) (Temporal)   Ht 5\' 7"  (1.702 m)   Wt 170 lb (77.1 kg)   SpO2 99%   BMI 26.63 kg/m  Wt Readings from Last 3 Encounters:  09/03/22 170 lb (77.1 kg)  08/20/22 161 lb (73 kg)  03/23/22 156 lb (70.8 kg)       Assessment & Plan:   Problem List Items Addressed This Visit       Other   Elevated bilirubin   Relevant Orders    Bilirubin, fractionated(tot/dir/indir)   Other Visit Diagnoses     Prediabetes    -  Primary   Relevant Orders   Hemoglobin A1c   Family history of type 2 diabetes mellitus in mother       Relevant Orders   Hemoglobin A1c      Hemoglobin A1c and fractionated bilirubin ordered today.  Continue with a healthy diet and exercise.  Discussed that she may have Gilbert's syndrome which is benign. Follow-up pending results  I am having 05/23/22 maintain her levonorgestrel, traZODone, clindamycin-tretinoin, hydrOXYzine, and predniSONE.  No orders of the defined types were placed in this encounter.

## 2022-09-03 NOTE — Patient Instructions (Signed)
Please go downstairs for labs before you leave.

## 2022-09-04 LAB — BILIRUBIN, FRACTIONATED(TOT/DIR/INDIR)
Bilirubin, Direct: 0.2 mg/dL (ref 0.0–0.2)
Indirect Bilirubin: 0.6 mg/dL (calc) (ref 0.2–1.2)
Total Bilirubin: 0.8 mg/dL (ref 0.2–1.2)

## 2022-09-06 ENCOUNTER — Encounter: Payer: Self-pay | Admitting: Family Medicine

## 2022-09-06 ENCOUNTER — Telehealth: Payer: Self-pay

## 2022-09-06 NOTE — Telephone Encounter (Signed)
Received call from patient concerning-most recent test results. Advised patient test results have not resulted as of 09/06/2022. We will call patient once results have been reviewed by the provider.

## 2022-09-07 LAB — HSV-2 AB, IGG

## 2022-09-07 NOTE — Telephone Encounter (Signed)
Pt is wondering if you think she should go on metformin due to her slight increase in A1c (from 5.7 to 5.8)

## 2022-09-08 LAB — HSV-2 AB, IGG: HSV 2 IgG, Type Spec: <0.91 index (ref 0.00–0.90)

## 2022-09-08 LAB — SPECIMEN STATUS REPORT

## 2022-09-10 LAB — HSV 1 ANTIBODY, IGG: HSV 1 Glycoprotein G Ab, IgG: <0.91 index (ref 0.00–0.90)

## 2022-09-14 ENCOUNTER — Ambulatory Visit (INDEPENDENT_AMBULATORY_CARE_PROVIDER_SITE_OTHER): Payer: Managed Care, Other (non HMO) | Admitting: Orthopaedic Surgery

## 2022-09-14 DIAGNOSIS — G8929 Other chronic pain: Secondary | ICD-10-CM | POA: Diagnosis not present

## 2022-09-14 DIAGNOSIS — M25551 Pain in right hip: Secondary | ICD-10-CM | POA: Diagnosis not present

## 2022-09-14 NOTE — Addendum Note (Signed)
Addended by: Wendi Maya on: 09/14/2022 12:45 PM   Modules accepted: Orders

## 2022-09-14 NOTE — Progress Notes (Signed)
Office Visit Note   Patient: Courtney Arellano           Date of Birth: Mar 02, 1986           MRN: [id number] Visit Date: 09/14/2022              Requested by: Avanell Shackleton, NP-C [address] [place],  [place] [address] PCP: Avanell Shackleton, NP-C   Assessment & Plan: Visit Diagnoses:  1. Chronic right hip pain     Plan: Impression is chronic right hip pain.  Etiology could be labral tear versus hip flexor tendinitis.  She has undergone extensive conservative management without significant improvement therefore we will order an MR arthrogram to further assess for structural abnormalities.  Follow-up after the MRI.  Follow-Up Instructions: No follow-ups on file.   Orders:  No orders of the defined types were placed in this encounter.  No orders of the defined types were placed in this encounter.     Procedures: No procedures performed   Clinical Data: No additional findings.   Subjective: Chief Complaint  Patient presents with   Right Hip - Pain    HPI Courtney Arellano is a very pleasant 36 year old female here for evaluation of chronic right hip and groin pain for 2 years.  Denies any injuries.  Pain has gotten worse over time.  She localizes the pain around the hip flexor region.  Denies any numbness or tingling or burning sensation.  Denies any back pain.  Does yoga 3 times a week.  She is very active and hiking walking and exercising.  She previously saw PCP who put her on muscle relaxer which did not help.  She has not done physical therapy.  She has taken anti-inflammatories without significant relief.  Review of Systems  Constitutional: Negative.   HENT: Negative.    Eyes: Negative.   Respiratory: Negative.    Cardiovascular: Negative.   Endocrine: Negative.   Musculoskeletal: Negative.   Neurological: Negative.   Hematological: Negative.   Psychiatric/Behavioral: Negative.    All other systems reviewed and are negative.    Objective: Vital Signs:  There were no vitals taken for this visit.  Physical Exam Vitals and nursing note reviewed.  Constitutional:      Appearance: She is well-developed.  HENT:     Head: Atraumatic.     Nose: Nose normal.  Eyes:     Extraocular Movements: Extraocular movements intact.  Cardiovascular:     Pulses: Normal pulses.  Pulmonary:     Effort: Pulmonary effort is normal.  Abdominal:     Palpations: Abdomen is soft.  Musculoskeletal:     Cervical back: Neck supple.  Skin:    General: Skin is warm.     Capillary Refill: Capillary refill takes less than 2 seconds.  Neurological:     Mental Status: She is alert. Mental status is at baseline.  Psychiatric:        Behavior: Behavior normal.        Thought Content: Thought content normal.        Judgment: Judgment normal.     Ortho Exam Examination of the right hip shows full range of motion.  No pain with logroll.  There is no trochanteric tenderness.  She has good hip flexor strength with mild pain that localizes to the hip flexor region.  Slight tenderness to the ASIS and the sartorius insertion.  No sciatic tension signs.  Negative Faber.  Negative Ober. Specialty Comments:  No specialty comments  available.  Imaging: No results found.   PMFS History: Patient Active Problem List   Diagnosis Date Noted   Chronic right hip pain 08/20/2022   Sleep disturbances 08/20/2022   Leukopenia 07/21/2020   Family history of lupus erythematosus 07/21/2020   Elevated bilirubin 07/21/2020   Candidiasis of vagina 08/07/2019   Chronic migraine without aura without status migrainosus, not intractable 07/10/2019   Migraine 05/17/2017   IUD contraception 08/27/2014   Past Medical History:  Diagnosis Date   IUD (intrauterine device) in place    Migraine     Family History  Problem Relation Age of Onset   Diabetes Mother    Hypertension Mother    Lupus Mother    Kidney failure Mother    Hypertension Father    Kidney failure Paternal  Grandmother     Past Surgical History:  Procedure Laterality Date   ROOT CANAL Right    Social History   Occupational History   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    Comment: mirena inserted 08-2019-1st intercourse 36 yo-More than 5 partners

## 2022-09-29 ENCOUNTER — Other Ambulatory Visit: Payer: Managed Care, Other (non HMO)

## 2022-09-29 ENCOUNTER — Inpatient Hospital Stay: Admission: RE | Admit: 2022-09-29 | Payer: Managed Care, Other (non HMO) | Source: Ambulatory Visit

## 2022-10-25 ENCOUNTER — Other Ambulatory Visit: Payer: Managed Care, Other (non HMO)

## 2022-10-25 ENCOUNTER — Inpatient Hospital Stay: Admission: RE | Admit: 2022-10-25 | Payer: Managed Care, Other (non HMO) | Source: Ambulatory Visit

## 2022-11-03 ENCOUNTER — Encounter: Payer: Managed Care, Other (non HMO) | Admitting: Registered Nurse

## 2022-11-16 ENCOUNTER — Ambulatory Visit
Admission: RE | Admit: 2022-11-16 | Discharge: 2022-11-16 | Disposition: A | Discharge status: Home / Self Care | Payer: PRIVATE HEALTH INSURANCE | Source: Ambulatory Visit | Attending: Orthopaedic Surgery | Admitting: Orthopaedic Surgery

## 2022-11-16 DIAGNOSIS — G8929 Other chronic pain: Secondary | ICD-10-CM

## 2022-11-16 MED ORDER — IOPAMIDOL (ISOVUE-M 200) INJECTION 41%
12.0000 mL | Freq: Once | INTRAMUSCULAR | Status: AC
  Administered 2022-11-16: 12 mL via INTRA_ARTICULAR

## 2022-12-30 ENCOUNTER — Ambulatory Visit (INDEPENDENT_AMBULATORY_CARE_PROVIDER_SITE_OTHER): Payer: No Typology Code available for payment source | Admitting: Physician Assistant

## 2022-12-30 ENCOUNTER — Other Ambulatory Visit (INDEPENDENT_AMBULATORY_CARE_PROVIDER_SITE_OTHER): Payer: No Typology Code available for payment source

## 2022-12-30 ENCOUNTER — Encounter: Payer: Self-pay | Admitting: Physician Assistant

## 2022-12-30 DIAGNOSIS — M79674 Pain in right toe(s): Secondary | ICD-10-CM | POA: Diagnosis not present

## 2022-12-30 DIAGNOSIS — M7989 Other specified soft tissue disorders: Secondary | ICD-10-CM

## 2022-12-30 NOTE — Progress Notes (Signed)
Office Visit Note   Patient: Courtney Arellano           Date of Birth: March 23, 1986           MRN: [id number] Visit Date: 12/30/2022              Requested by: Girtha Rm, NP-C [place],  [place] [address] PCP: Girtha Rm, NP-C   Assessment & Plan: Visit Diagnoses:  1. Pain and swelling of toe, right     Plan: X-rays today consistent with oblique comminuted fracture of the proximal phalanx of the right fifth toe.  There is some slight angulation.  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  Talked about the natural history of this I recommended a stiff shoe and buddy taping.  She also has some postoperative shoes from a time when her daughter injured her foot.  She will follow-up in 2 weeks.  X-rays of her toe at that time  Follow-Up Instructions: Return in about 2 weeks (around 01/13/2023).   Orders:  Orders Placed This Encounter  Procedures   XR Toe 5th Right   No orders of the defined types were placed in this encounter.     Procedures: No procedures performed   Clinical Data: No additional findings.   Subjective: No chief complaint on file.   HPI patient is a pleasant 37 year old woman who is on vacation and was at a pool deck when she hit it on a rock yesterday.  No previous history of injury she has had swelling and pain since.  Pain Assessment  Average Pain: 4 Current Pain: 4 Aggravating Factors: Bending through the toe Alleviating Factors: Immobilization Review of Systems  All other systems reviewed and are negative.    Objective: Vital Signs: There were no vitals taken for this visit.  Physical Exam Constitutional:      Appearance: Normal appearance.  Pulmonary:     Effort: Pulmonary effort is normal.  Skin:    General: Skin is warm and dry.  Neurological:     Mental Status: She is alert.     Ortho Exam Examination of her  right foot she has moderate soft tissue swelling of the right fifth toe.  Sensation is intact she has brisk capillary refill no evidence of cellulitis.  She does have some varus deformity of the toe but similar to toe on other foot.  No other tenderness Specialty Comments:  No specialty comments available.  Imaging: XR Toe 5th Right  Result Date: 12/30/2022 Radiographs of her right fifth toe demonstrate an acute fracture of the fifth proximal phalanx 9 intra-articular.  Some angulation.  No evidence of dislocation    PMFS History: Patient Active Problem List   Diagnosis Date Noted   Chronic right hip pain 08/20/2022   Sleep disturbances 08/20/2022   Leukopenia 07/21/2020   Family history of lupus erythematosus 07/21/2020   Elevated bilirubin 07/21/2020   Candidiasis of vagina 08/07/2019   Chronic migraine without aura without status migrainosus, not intractable 07/10/2019   Migraine 05/17/2017   IUD contraception 08/27/2014   Past Medical History:  Diagnosis Date   IUD (intrauterine device) in place    Migraine     Family History  Problem Relation Age of Onset   Diabetes Mother    Hypertension Mother    Lupus Mother    Kidney failure Mother    Hypertension Father  Kidney failure Paternal Grandmother     Past Surgical History:  Procedure Laterality Date   ROOT CANAL Right    Social History   Occupational History   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    Comment: mirena inserted 08-2019-1st intercourse 37 yo-More than 5 partners

## 2023-01-06 ENCOUNTER — Ambulatory Visit: Payer: Self-pay | Admitting: Orthopaedic Surgery

## 2023-03-24 ENCOUNTER — Ambulatory Visit: Payer: 59 | Admitting: Radiology

## 2023-03-24 ENCOUNTER — Encounter: Payer: Self-pay | Admitting: Radiology

## 2023-03-24 VITALS — BP 96/70

## 2023-03-24 DIAGNOSIS — Z113 Encounter for screening for infections with a predominantly sexual mode of transmission: Secondary | ICD-10-CM

## 2023-03-24 DIAGNOSIS — N898 Other specified noninflammatory disorders of vagina: Secondary | ICD-10-CM

## 2023-03-24 DIAGNOSIS — N760 Acute vaginitis: Secondary | ICD-10-CM | POA: Diagnosis not present

## 2023-03-24 LAB — WET PREP FOR TRICH, YEAST, CLUE

## 2023-03-24 MED ORDER — METRONIDAZOLE 500 MG PO TABS
500.0000 mg | ORAL_TABLET | Freq: Two times a day (BID) | ORAL | 0 refills | Status: DC
Start: 2023-03-24 — End: 2023-09-21

## 2023-03-24 NOTE — Progress Notes (Signed)
      Subjective: Courtney Arellano is a 37 y.o. female who complains of vaginal odor after using a new bath product. Would like STI Screen including blood work, has a new sexual partner.   Review of Systems  All other systems reviewed and are negative.   Past Medical History:  Diagnosis Date   IUD (intrauterine device) in place    Migraine       Objective:  Today's Vitals   03/24/23 0818  BP: 96/70   There is no height or weight on file to calculate BMI.   -General: no acute distress -Vulva: without lesions or discharge -Vagina: discharge present, aptima swab and wet prep obtained -Cervix: no lesion or discharge, no CMT -Perineum: no lesions -Uterus: Mobile, non tender -Adnexa: no masses or tenderness   Microscopic wet-mount exam shows clue cells.   Raynelle Fanning, CMA present for exam  Assessment:/Plan:   1. Screening for STDs (sexually transmitted diseases) - SURESWAB CT/NG/T. vaginalis - HIV antibody (with reflex) - RPR - Hepatitis B Surface AntiGEN - Hepatitis C antibody  2. Vaginal odor - WET PREP FOR TRICH, YEAST, CLUE  +BV Rx sent for flagyl  Will contact patient with results of testing completed today. Avoid intercourse until symptoms are resolved. Safe sex encouraged. Avoid the use of soaps or perfumed products in the peri area. Avoid tub baths and sitting in sweaty or wet clothing for prolonged periods of time.

## 2023-03-25 LAB — HEPATITIS C ANTIBODY: Hepatitis C Ab: NONREACTIVE

## 2023-03-25 LAB — HEPATITIS B SURFACE ANTIGEN: Hepatitis B Surface Ag: NONREACTIVE

## 2023-03-25 LAB — SURESWAB CT/NG/T. VAGINALIS
C. trachomatis RNA, TMA: NOT DETECTED
N. gonorrhoeae RNA, TMA: NOT DETECTED
Trichomonas vaginalis RNA: NOT DETECTED

## 2023-03-25 LAB — RPR: RPR Ser Ql: NONREACTIVE

## 2023-03-25 LAB — HIV ANTIBODY (ROUTINE TESTING W REFLEX): HIV 1&2 Ab, 4th Generation: NONREACTIVE

## 2023-06-14 ENCOUNTER — Ambulatory Visit: Payer: 59 | Admitting: Radiology

## 2023-07-18 ENCOUNTER — Telehealth: Payer: 59 | Admitting: Emergency Medicine

## 2023-07-18 DIAGNOSIS — J029 Acute pharyngitis, unspecified: Secondary | ICD-10-CM

## 2023-07-18 MED ORDER — AMOXICILLIN 500 MG PO CAPS: 500.0000 mg | ORAL_CAPSULE | Freq: Three times a day (TID) | ORAL | 0 refills | Status: AC

## 2023-07-18 MED ORDER — PREDNISONE 20 MG PO TABS: 40.0000 mg | ORAL_TABLET | Freq: Every day | ORAL | 0 refills | Status: DC

## 2023-07-18 NOTE — Patient Instructions (Signed)
  Christene Slates, thank you for joining Roxy Horseman, PA-C for today's virtual visit.  While this provider is not your primary care provider (PCP), if your PCP is located in our provider database this encounter information will be shared with them immediately following your visit.   A [facility] MyChart account gives you access to today's visit and all your visits, tests, and labs performed at [facility] " click here if you don't have a [facility] MyChart account or go to [email or web address]  Consent: (Patient) Courtney Arellano provided verbal consent for this virtual visit at the beginning of the encounter.  Current Medications:  Current Outpatient Medications:    amoxicillin (AMOXIL) 500 MG capsule, Take 1 capsule (500 mg total) by mouth 3 (three) times daily for 10 days., Disp: 30 capsule, Rfl: 0   predniSONE (DELTASONE) 20 MG tablet, Take 2 tablets (40 mg total) by mouth daily., Disp: 10 tablet, Rfl: 0   clindamycin-tretinoin (ZIANA) gel, Apply topically at bedtime., Disp: 30 g, Rfl: 2   levonorgestrel (MIRENA) 20 MCG/24HR IUD, 1 each by Intrauterine route once., Disp: , Rfl:    metroNIDAZOLE (FLAGYL) 500 MG tablet, Take 1 tablet (500 mg total) by mouth 2 (two) times daily., Disp: 14 tablet, Rfl: 0   traZODone (DESYREL) 50 MG tablet, Take 0.5-1 tablets (25-50 mg total) by mouth at bedtime as needed for sleep., Disp: 30 tablet, Rfl: 3   Medications ordered in this encounter:  Meds ordered this encounter  Medications   predniSONE (DELTASONE) 20 MG tablet    Sig: Take 2 tablets (40 mg total) by mouth daily.    Dispense:  10 tablet    Refill:  0    Order Specific Question:   Supervising Provider    Answer:   Merrilee Jansky [[id number]]   amoxicillin (AMOXIL) 500 MG capsule    Sig: Take 1 capsule (500 mg total) by mouth 3 (three) times daily for 10 days.    Dispense:  30 capsule    Refill:  0    Order Specific Question:   Supervising Provider    Answer:    Merrilee Jansky [id number]     *If you need refills on other medications prior to your next appointment, please contact your pharmacy*  Follow-Up: Call back or seek an in-person evaluation if the symptoms worsen or if the condition fails to improve as anticipated.  [facility] [phone]  Other Instructions    If you have been instructed to have an in-person evaluation today at a local Urgent Care facility, please use the link below. It will take you to a list of all of our available [facility], including address, phone number and hours of operation. Please do not delay care.  [facility]  If you or a family member do not have a primary care provider, use the link below to schedule a visit and establish care. When you choose a [facility] primary care physician or advanced practice provider, you gain a long-term partner in health. Find a Primary Care Provider  Learn more about [facility]'s in-office and virtual care options:  - Get Care Now

## 2023-07-18 NOTE — Progress Notes (Signed)
Virtual Visit Consent   Courtney Arellano, you are scheduled for a virtual visit with a [facility] provider today. Just as with appointments in the office, your consent must be obtained to participate. Your consent will be active for this visit and any virtual visit you may have with one of our providers in the next 365 days. If you have a MyChart account, a copy of this consent can be sent to you electronically.  As this is a virtual visit, video technology does not allow for your provider to perform a traditional examination. This may limit your provider's ability to fully assess your condition. If your provider identifies any concerns that need to be evaluated in person or the need to arrange testing (such as labs, EKG, etc.), we will make arrangements to do so. Although advances in technology are sophisticated, we cannot ensure that it will always work on either your end or our end. If the connection with a video visit is poor, the visit may have to be switched to a telephone visit. With either a video or telephone visit, we are not always able to ensure that we have a secure connection.  By engaging in this virtual visit, you consent to the provision of healthcare and authorize for your insurance to be billed (if applicable) for the services provided during this visit. Depending on your insurance coverage, you may receive a charge related to this service.  I need to obtain your verbal consent now. Are you willing to proceed with your visit today? Courtney Arellano has provided verbal consent on 07/18/2023 for a virtual visit (video or telephone). Roxy Horseman, PA-C  Date: 07/18/2023 12:16 PM  Virtual Visit via Video Note   I, Roxy Horseman, connected with  Courtney Arellano  ([id number], [birth year]) on 07/18/23 at 12:15 PM EDT by a video-enabled telemedicine application and verified that I am speaking with the correct person using two identifiers.  Location: Patient: Virtual Visit Location  Patient: Other: office Provider: Virtual Visit Location Provider: Home   I discussed the limitations of evaluation and management by telemedicine and the availability of in person appointments. The patient expressed understanding and agreed to proceed.    History of Present Illness: Courtney Arellano is a 37 y.o. who identifies as a female who was assigned female at birth, and is being seen today for sore throat all weekend.  States that she has taken advil with good relief.  States that she hasn't had cough.  States this feel identical to the last time she had strep.  States that she hasn't taken a COVID test, but she has one at home.Marland Kitchen  HPI: HPI  Problems:  Patient Active Problem List   Diagnosis Date Noted   Chronic right hip pain 08/20/2022   Sleep disturbances 08/20/2022   Leukopenia 07/21/2020   Family history of lupus erythematosus 07/21/2020   Elevated bilirubin 07/21/2020   Candidiasis of vagina 08/07/2019   Chronic migraine without aura without status migrainosus, not intractable 07/10/2019   Migraine 05/17/2017   IUD contraception 08/27/2014    Allergies: No Known Allergies Medications:  Current Outpatient Medications:    clindamycin-tretinoin (ZIANA) gel, Apply topically at bedtime., Disp: 30 g, Rfl: 2   levonorgestrel (MIRENA) 20 MCG/24HR IUD, 1 each by Intrauterine route once., Disp: , Rfl:    metroNIDAZOLE (FLAGYL) 500 MG tablet, Take 1 tablet (500 mg total) by mouth 2 (two) times daily., Disp: 14 tablet, Rfl: 0   traZODone (DESYREL) 50 MG tablet, Take 0.5-1 tablets (25-50 mg  total) by mouth at bedtime as needed for sleep., Disp: 30 tablet, Rfl: 3  Observations/Objective: Patient is well-developed, well-nourished in no acute distress.  Resting comfortably  at work.  Head is normocephalic, atraumatic.  No labored breathing.  Speech is clear and coherent with logical content.  Patient is alert and oriented at baseline.    Assessment and Plan: 1. Pharyngitis,  unspecified etiology   Meds ordered this encounter  Medications   predniSONE (DELTASONE) 20 MG tablet    Sig: Take 2 tablets (40 mg total) by mouth daily.    Dispense:  10 tablet    Refill:  0    Order Specific Question:   Supervising Provider    Answer:   Merrilee Jansky [[id number]]   amoxicillin (AMOXIL) 500 MG capsule    Sig: Take 1 capsule (500 mg total) by mouth 3 (three) times daily for 10 days.    Dispense:  30 capsule    Refill:  0    Order Specific Question:   Supervising Provider    Answer:   Merrilee Jansky [id number]   Patient will take home COVID test.  If negative, will start treatment as above.  If positive, supportive care.   Follow Up Instructions: I discussed the assessment and treatment plan with the patient. The patient was provided an opportunity to ask questions and all were answered. The patient agreed with the plan and demonstrated an understanding of the instructions.  A copy of instructions were sent to the patient via MyChart unless otherwise noted below.     The patient was advised to call back or seek an in-person evaluation if the symptoms worsen or if the condition fails to improve as anticipated.  Time:  I spent 10 minutes with the patient via telehealth technology discussing the above problems/concerns.    Roxy Horseman, PA-C

## 2023-09-21 ENCOUNTER — Ambulatory Visit: Payer: 59 | Admitting: Family Medicine

## 2023-09-21 ENCOUNTER — Encounter: Payer: Self-pay | Admitting: Family Medicine

## 2023-09-21 VITALS — BP 102/72 | HR 64 | Temp 97.6°F | Ht 67.0 in | Wt 157.0 lb

## 2023-09-21 DIAGNOSIS — Z833 Family history of diabetes mellitus: Secondary | ICD-10-CM | POA: Diagnosis not present

## 2023-09-21 DIAGNOSIS — Z1329 Encounter for screening for other suspected endocrine disorder: Secondary | ICD-10-CM | POA: Diagnosis not present

## 2023-09-21 DIAGNOSIS — Z13220 Encounter for screening for lipoid disorders: Secondary | ICD-10-CM

## 2023-09-21 DIAGNOSIS — Z0000 Encounter for general adult medical examination without abnormal findings: Secondary | ICD-10-CM | POA: Diagnosis not present

## 2023-09-21 DIAGNOSIS — Z0001 Encounter for general adult medical examination with abnormal findings: Secondary | ICD-10-CM

## 2023-09-21 NOTE — Progress Notes (Signed)
Complete physical exam  Patient: Courtney Arellano   DOB: Feb 04, 1986   37 y.o. Female  MRN: [id number]  Subjective:    Chief Complaint  Patient presents with   Annual Exam   She is new to the practice and here for a complete physical exam. UTD with OB/GYN   Health Maintenance  Topic Date Due   COVID-19 Vaccine (2 - 2023-24 season) 10/07/2023*   Flu Shot  01/16/2024*   Pap with HPV screening  03/24/2027   DTaP/Tdap/Td vaccine (2 - Td or Tdap) 05/06/2030   Hepatitis C Screening  Completed   HIV Screening  Completed   HPV Vaccine  Aged Out  *Topic was postponed. The date shown is not the original due date.    Wears seatbelt always, smoke detectors in home and functioning, does not text while driving, feels safe in home environment.  Depression screening:    09/21/2023    3:56 PM 08/20/2022    9:57 AM 10/30/2021    8:21 AM  Depression screen PHQ 2/9  Decreased Interest 0 0 0  Down, Depressed, Hopeless 0 0 0  PHQ - 2 Score 0 0 0  Altered sleeping  2 1  Tired, decreased energy  1 1  Change in appetite  0 0  Feeling bad or failure about yourself   0 0  Trouble concentrating  2 1  Moving slowly or fidgety/restless  0 0  Suicidal thoughts  0 0  PHQ-9 Score  5 3  Difficult doing work/chores  Not difficult at all    Anxiety Screening:    05/06/2020   10:19 AM  GAD 7 : Generalized Anxiety Score  Nervous, Anxious, on Edge 0  Control/stop worrying 0  Worry too much - different things 0  Trouble relaxing 0  Restless 0  Easily annoyed or irritable 0  Afraid - awful might happen 0  Total GAD 7 Score 0  Anxiety Difficulty Not difficult at all    Vision:Not within last year  and Dental: No current dental problems and Receives regular dental care  Patient Active Problem List   Diagnosis Date Noted   Chronic right hip pain 08/20/2022   Sleep disturbances 08/20/2022   Leukopenia 07/21/2020   Family history of lupus erythematosus 07/21/2020   Elevated bilirubin 07/21/2020    Candidiasis of vagina 08/07/2019   Chronic migraine without aura without status migrainosus, not intractable 07/10/2019   Migraine 05/17/2017   IUD contraception 08/27/2014   Past Medical History:  Diagnosis Date   IUD (intrauterine device) in place    Migraine    Past Surgical History:  Procedure Laterality Date   BROW LIFT AND BLEPHAROPLASTY     RHINOPLASTY     ROOT CANAL Right    Social History   Tobacco Use   Smoking status: Never   Smokeless tobacco: Never  Vaping Use   Vaping status: Never Used  Substance Use Topics   Alcohol use: Yes    Comment: Social   Drug use: No      Patient Care Team: Avanell Shackleton, NP-C as PCP - General (Family Medicine) Drema Dallas, DO as Consulting Physician (Neurology)   Outpatient Medications Prior to Visit  Medication Sig   clindamycin-tretinoin (ZIANA) gel Apply topically at bedtime.   levonorgestrel (MIRENA) 20 MCG/24HR IUD 1 each by Intrauterine route once.   traZODone (DESYREL) 50 MG tablet Take 0.5-1 tablets (25-50 mg total) by mouth at bedtime as needed for sleep.   [DISCONTINUED] predniSONE (DELTASONE)  20 MG tablet Take 2 tablets (40 mg total) by mouth daily.   [DISCONTINUED] metroNIDAZOLE (FLAGYL) 500 MG tablet Take 1 tablet (500 mg total) by mouth 2 (two) times daily. (Patient not taking: Reported on 09/21/2023)   No facility-administered medications prior to visit.    Review of Systems  Constitutional:  Negative for chills, fever and malaise/fatigue.  HENT:  Negative for congestion, ear pain, sinus pain and sore throat.   Eyes:  Negative for blurred vision, double vision and pain.  Respiratory:  Negative for cough, shortness of breath and wheezing.   Cardiovascular:  Negative for chest pain, palpitations and leg swelling.  Gastrointestinal:  Negative for abdominal pain, constipation, diarrhea, nausea and vomiting.  Genitourinary:  Negative for dysuria, frequency and urgency.  Musculoskeletal:  Negative for back  pain, joint pain and myalgias.  Neurological:  Negative for dizziness, tingling, focal weakness and headaches.  Psychiatric/Behavioral:  Negative for depression. The patient is not nervous/anxious.        Objective:    BP 102/72 (BP Location: Left Arm, Patient Position: Sitting, Cuff Size: Large)   Pulse 64   Temp 97.6 F (36.4 C) (Temporal)   Ht 5\' 7"  (1.702 m)   Wt 157 lb (71.2 kg)   SpO2 98%   BMI 24.59 kg/m  BP Readings from Last 3 Encounters:  09/21/23 102/72  03/24/23 96/70  09/03/22 106/74   Wt Readings from Last 3 Encounters:  09/21/23 157 lb (71.2 kg)  09/03/22 170 lb (77.1 kg)  08/20/22 161 lb (73 kg)    Physical Exam Constitutional:      General: She is not in acute distress. HENT:     Right Ear: Tympanic membrane, ear canal and external ear normal.     Left Ear: Tympanic membrane, ear canal and external ear normal.     Nose: Nose normal.     Mouth/Throat:     Mouth: Mucous membranes are moist.     Pharynx: Oropharynx is clear.  Eyes:     Extraocular Movements: Extraocular movements intact.     Conjunctiva/sclera: Conjunctivae normal.     Pupils: Pupils are equal, round, and reactive to light.  Neck:     Thyroid: No thyroid mass, thyromegaly or thyroid tenderness.  Cardiovascular:     Rate and Rhythm: Normal rate and regular rhythm.     Pulses: Normal pulses.     Heart sounds: Normal heart sounds.  Pulmonary:     Effort: Pulmonary effort is normal.     Breath sounds: Normal breath sounds.  Abdominal:     General: Bowel sounds are normal.     Palpations: Abdomen is soft.     Tenderness: There is no abdominal tenderness. There is no right CVA tenderness, left CVA tenderness, guarding or rebound.  Musculoskeletal:        General: Normal range of motion.     Cervical back: Normal range of motion and neck supple. No tenderness.     Right lower leg: No edema.     Left lower leg: No edema.  Lymphadenopathy:     Cervical: No cervical adenopathy.   Skin:    General: Skin is warm and dry.     Findings: No lesion or rash.  Neurological:     General: No focal deficit present.     Mental Status: She is alert and oriented to person, place, and time.     Cranial Nerves: No cranial nerve deficit.     Sensory: No sensory deficit.  Motor: No weakness.     Gait: Gait normal.  Psychiatric:        Mood and Affect: Mood normal.        Behavior: Behavior normal.        Thought Content: Thought content normal.      No results found for any visits on 09/21/23.    Assessment & Plan:    Routine Health Maintenance and Physical Exam  Problem List Items Addressed This Visit   None Visit Diagnoses     Encounter for general adult medical examination with abnormal findings    -  Primary   Relevant Orders   CBC with Differential/Platelet   Comprehensive metabolic panel   Family history of type 2 diabetes mellitus in mother       Relevant Orders   Hemoglobin A1c   Screening for lipid disorders       Relevant Orders   Lipid panel   Screening for thyroid disorder       Relevant Orders   TSH      Preventive health care reviewed.  UTD with OB/GYN. Counseling on healthy lifestyle including diet and exercise.  Recommend regular dental and eye exams.  Immunizations reviewed.  Discussed safety. Declines flu shot.    Return in about 1 year (around 09/20/2024).     Hetty Blend, NP-C

## 2023-09-21 NOTE — Patient Instructions (Signed)
Please go downstairs for labs and we will be in touch with your results.

## 2023-09-22 LAB — CBC WITH DIFFERENTIAL/PLATELET
Basophils Absolute: 0.1 10*3/uL (ref 0.0–0.1)
Basophils Relative: 1.4 % (ref 0.0–3.0)
Eosinophils Absolute: 0.1 10*3/uL (ref 0.0–0.7)
Eosinophils Relative: 2.6 % (ref 0.0–5.0)
HCT: 40.8 % (ref 36.0–46.0)
Hemoglobin: 13.9 g/dL (ref 12.0–15.0)
Lymphocytes Relative: 44.8 % (ref 12.0–46.0)
Lymphs Abs: 1.6 10*3/uL (ref 0.7–4.0)
MCHC: 34 g/dL (ref 30.0–36.0)
MCV: 89.6 fL (ref 78.0–100.0)
Monocytes Absolute: 0.3 10*3/uL (ref 0.1–1.0)
Monocytes Relative: 8.1 % (ref 3.0–12.0)
Neutro Abs: 1.6 10*3/uL (ref 1.4–7.7)
Neutrophils Relative %: 43.1 % (ref 43.0–77.0)
Platelets: 210 10*3/uL (ref 150.0–400.0)
RBC: 4.55 Mil/uL (ref 3.87–5.11)
RDW: 13.1 % (ref 11.5–15.5)
WBC: 3.7 10*3/uL — ABNORMAL LOW (ref 4.0–10.5)

## 2023-09-22 LAB — COMPREHENSIVE METABOLIC PANEL
ALT: 15 U/L (ref 0–35)
AST: 20 U/L (ref 0–37)
Albumin: 4.7 g/dL (ref 3.5–5.2)
Alkaline Phosphatase: 51 U/L (ref 39–117)
BUN: 18 mg/dL (ref 6–23)
CO2: 29 meq/L (ref 19–32)
Calcium: 9.5 mg/dL (ref 8.4–10.5)
Chloride: 103 meq/L (ref 96–112)
Creatinine, Ser: 0.76 mg/dL (ref 0.40–1.20)
GFR: 99.13 mL/min (ref >60.00)
Glucose, Bld: 123 mg/dL — ABNORMAL HIGH (ref 70–99)
Potassium: 3.7 meq/L (ref 3.5–5.1)
Sodium: 139 meq/L (ref 135–145)
Total Bilirubin: 1.9 mg/dL — ABNORMAL HIGH (ref 0.2–1.2)
Total Protein: 8 g/dL (ref 6.0–8.3)

## 2023-09-22 LAB — LIPID PANEL
Cholesterol: 159 mg/dL (ref 0–200)
HDL: 56.5 mg/dL (ref >39.00)
LDL Cholesterol: 95 mg/dL (ref 0–99)
NonHDL: 102.82
Total CHOL/HDL Ratio: 3
Triglycerides: 39 mg/dL (ref 0.0–149.0)
VLDL: 7.8 mg/dL (ref 0.0–40.0)

## 2023-09-22 LAB — HEMOGLOBIN A1C: Hgb A1c MFr Bld: 5.7 % (ref 4.6–6.5)

## 2023-09-22 LAB — TSH: TSH: 1 u[IU]/mL (ref 0.35–5.50)

## 2023-10-20 ENCOUNTER — Ambulatory Visit: Payer: 59 | Admitting: Radiology

## 2023-12-08 ENCOUNTER — Ambulatory Visit: Payer: 59 | Admitting: Radiology

## 2024-01-24 ENCOUNTER — Ambulatory Visit (INDEPENDENT_AMBULATORY_CARE_PROVIDER_SITE_OTHER): Admitting: Radiology

## 2024-01-24 ENCOUNTER — Encounter: Payer: Self-pay | Admitting: Radiology

## 2024-01-24 VITALS — BP 112/78 | HR 63 | Ht 67.5 in | Wt 162.4 lb

## 2024-01-24 DIAGNOSIS — Z113 Encounter for screening for infections with a predominantly sexual mode of transmission: Secondary | ICD-10-CM

## 2024-01-24 DIAGNOSIS — Z1331 Encounter for screening for depression: Secondary | ICD-10-CM | POA: Diagnosis not present

## 2024-01-24 DIAGNOSIS — Z975 Presence of (intrauterine) contraceptive device: Secondary | ICD-10-CM | POA: Diagnosis not present

## 2024-01-24 DIAGNOSIS — Z01419 Encounter for gynecological examination (general) (routine) without abnormal findings: Secondary | ICD-10-CM | POA: Diagnosis not present

## 2024-01-24 NOTE — Patient Instructions (Signed)

## 2024-01-24 NOTE — Progress Notes (Signed)
 Courtney Arellano 25-Apr-1986 [id number]   History:  38 y.o. G1P1 presents for annual exam. Would like Sti screening today, has a new partner. IUD for [facility]. Has a PCP.   Gynecologic History No LMP recorded. (Menstrual status: IUD).   Contraception/Family planning: IUD placed 08/16/2019 Sexually active: yes Last Pap: 2023. Results were: normal   Obstetric History OB History  Gravida Para Term Preterm AB Living  1 1 1   1   SAB IAB Ectopic Multiple Live Births          # Outcome Date GA Lbr Len/2nd Weight Sex Type Anes PTL Lv  1 Term                01/24/2024   12:15 PM 09/21/2023    3:56 PM 08/20/2022    9:57 AM  Depression screen PHQ 2/9  Decreased Interest 0 0 0  Down, Depressed, Hopeless  0 0  PHQ - 2 Score 0 0 0  Altered sleeping   2  Tired, decreased energy   1  Change in appetite   0  Feeling bad or failure about yourself    0  Trouble concentrating   2  Moving slowly or fidgety/restless   0  Suicidal thoughts   0  PHQ-9 Score   5  Difficult doing work/chores   Not difficult at all     The following portions of the patient's history were reviewed and updated as appropriate: allergies, current medications, past family history, past medical history, past social history, past surgical history, and problem list.  ROS  Past medical history, past surgical history, family history and social history were all reviewed and documented in the EPIC chart.  Exam:  Vitals:   01/24/24 1213  BP: 112/78  Pulse: 63  SpO2: 97%  Weight: 162 lb 6.4 oz (73.7 kg)  Height: 5' 7.5" (1.715 m)   Body mass index is 25.06 kg/m.  Physical Exam Vitals and nursing note reviewed. Exam conducted with a chaperone present.  Constitutional:      Appearance: Normal appearance. She is normal weight.  HENT:     Head: Normocephalic and atraumatic.  Neck:     Thyroid: No thyroid mass, thyromegaly or thyroid tenderness.  Cardiovascular:     Rate and Rhythm: Regular rhythm.     Heart  sounds: Normal heart sounds.  Pulmonary:     Effort: Pulmonary effort is normal.     Breath sounds: Normal breath sounds.  Chest:  Breasts:    Breasts are symmetrical.     Right: Normal. No inverted nipple, mass, nipple discharge, skin change or tenderness.     Left: Normal. No inverted nipple, mass, nipple discharge, skin change or tenderness.  Abdominal:     General: Abdomen is flat. Bowel sounds are normal.     Palpations: Abdomen is soft.  Genitourinary:    General: Normal vulva.     Vagina: Normal. No vaginal discharge, bleeding or lesions.     Cervix: Normal. No discharge or lesion.     Uterus: Normal. Not enlarged and not tender.      Adnexa: Right adnexa normal and left adnexa normal.       Right: No mass, tenderness or fullness.         Left: No mass, tenderness or fullness.       Comments: Iud strings seen Lymphadenopathy:     Upper Body:     Right upper body: No axillary adenopathy.     Left upper  body: No axillary adenopathy.  Skin:    General: Skin is warm and dry.  Neurological:     Mental Status: She is alert and oriented to person, place, and time.  Psychiatric:        Mood and Affect: Mood normal.        Thought Content: Thought content normal.        Judgment: Judgment normal.      Raynelle Fanning, CMA present for exam  Assessment/Plan:   1. Well woman exam with routine gynecological exam (Primary) Pap 2026 Mammo at 39  2. Screening for STDs (sexually transmitted diseases) - HIV antibody (with reflex) - RPR - Hepatitis C antibody - SURESWAB CT/NG/T. vaginalis  3. IUD (intrauterine device) in place May remain until 2028     Return in about 1 year (around 01/23/2025) for Annual.  Arlie Solomons B WHNP-BC 12:32 PM 01/24/2024

## 2024-01-25 LAB — HEPATITIS C ANTIBODY: Hepatitis C Ab: NONREACTIVE

## 2024-01-25 LAB — HIV ANTIBODY (ROUTINE TESTING W REFLEX): HIV 1&2 Ab, 4th Generation: NONREACTIVE

## 2024-01-25 LAB — RPR: RPR Ser Ql: NONREACTIVE

## 2024-01-26 LAB — SURESWAB CT/NG/T. VAGINALIS
C. trachomatis RNA, TMA: NOT DETECTED
N. gonorrhoeae RNA, TMA: NOT DETECTED
Trichomonas vaginalis RNA: NOT DETECTED

## 2024-03-29 ENCOUNTER — Ambulatory Visit: Admitting: Obstetrics and Gynecology

## 2024-08-17 ENCOUNTER — Encounter

## 2024-08-22 ENCOUNTER — Ambulatory Visit: Admitting: Obstetrics and Gynecology

## 2024-08-22 ENCOUNTER — Other Ambulatory Visit: Payer: Self-pay | Admitting: Obstetrics and Gynecology

## 2024-08-22 ENCOUNTER — Encounter: Payer: Self-pay | Admitting: Obstetrics and Gynecology

## 2024-08-22 VITALS — BP 114/70 | HR 72

## 2024-08-22 DIAGNOSIS — Z113 Encounter for screening for infections with a predominantly sexual mode of transmission: Secondary | ICD-10-CM | POA: Diagnosis not present

## 2024-08-22 DIAGNOSIS — Z23 Encounter for immunization: Secondary | ICD-10-CM | POA: Diagnosis not present

## 2024-08-22 DIAGNOSIS — Z20828 Contact with and (suspected) exposure to other viral communicable diseases: Secondary | ICD-10-CM | POA: Diagnosis not present

## 2024-08-22 DIAGNOSIS — Z1231 Encounter for screening mammogram for malignant neoplasm of breast: Secondary | ICD-10-CM

## 2024-08-22 NOTE — Addendum Note (Signed)
 Addended by: Katiria Calame M on: 08/22/2024 05:07 PM   Modules accepted: Orders

## 2024-08-22 NOTE — Progress Notes (Signed)
   Acute Office Visit  Subjective:    Patient ID: Courtney Arellano, female    DOB: October 02, 1986, 38 y.o.   MRN: [id number]   HPI 38 y.o. 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 No sxs. ) . Two partners in the past year. Had negative HSV testing and is regular with all her STI testing. April did not have HSV testing. No outbreaks or symptoms. Is concerned about exposure to the person she is with now and has expressed this to him. Gardesil: denies having and would like to have this. No LMP recorded. (Menstrual status: IUD).    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 Can do suppression dosing of valtrex but is more reserved for frequent outbreaks or during pregnancy, but will honor that if she desires. She would only like to do HIV and repeat HSV testing today to confirm it was positive. She will return with any lesions of concern and discussed we can swab to confirm HSV.  Discussed first outbreak, if it occurs can be the worst.   Counseled on gardesil vaccination and importance.  She would like to receive the vaccine #1 of 3 given today Continue annual exams.   30 minutes spent on reviewing records, imaging,  and one on one patient time and counseling patient and documentation Dr. Glennon Almarie MARLA Glennon

## 2024-08-22 NOTE — Patient Instructions (Signed)
 Genital Herpes Genital herpes is a common sexually transmitted infection (STI) that is caused by a virus. The virus spreads from person to person through contact with a sore, infected saliva, or infected skin. The virus can cause itching, blisters, and sores around the genitals or rectum. During an outbreak of infection, symptoms may last for several days and then go away. However, the virus remains in the body, so more outbreaks may happen in the future. The time between outbreaks varies and can be from months to years. Genital herpes can affect anyone. It is particularly concerning for pregnant women because the virus can be passed to the baby during delivery. Genital herpes is also a concern for people who have a weak disease-fighting system (immune system). What are the causes? This condition is caused by the herpes simplex virus, type 1 or type 2 (HSV-1 or HSV-2). The virus may spread through: Sexual contact with an infected person, including vaginal, anal, and oral sex. Contact with a herpes sore. The skin. This means that you can get herpes from an infected partner even if there are no blisters or sores present. Your partner may not know that he or she is infected. What increases the risk? You are more likely to develop this condition if: You have sex with many partners. You do not use latex or polyurethane condoms during sex. What are the signs or symptoms? Most people do not have symptoms or they have mild symptoms that may be mistaken for other skin problems. Symptoms may include: Small, red bumps near the genitals, rectum, or mouth. These bumps turn into blisters and then sores. Flu-like (influenza-like) symptoms, including: Fever. Body aches. Swollen lymph nodes. Headache. Painful urination. Pain and itching in the genital area or rectal area. Vaginal discharge. Tingling or shooting pain in the legs and buttocks. Generally, symptoms are more severe and last longer during the first  (primary) outbreak. Influenza-like symptoms are also more common during the primary outbreak. How is this diagnosed? This condition may be diagnosed based on: A physical exam. Your medical history. Blood tests. A test of a fluid sample (culture) from an open sore. How is this treated? There is no cure for this condition, but treatment with antiviral medicines can do the following: Speed up healing and relieve symptoms. Help to reduce the spread of the virus to sexual partners. Limit the chance of future outbreaks, or make future outbreaks shorter. Lessen symptoms of future outbreaks. Your health care provider may also recommend over-the-counter medicines to help with pain and itching. Follow these instructions at home: If you have an outbreak:  Keep the affected areas dry and clean. Avoid rubbing or touching blisters and sores. If you do touch blisters or sores: Wash your hands thoroughly with soap and water for at least 20 seconds. If soap and water are not available, use an alcohol-based hand sanitizer. Do not touch your eyes afterward. Sexual activity Do not have sexual contact during active outbreaks. Practice safe sex. Herpes can spread even if your partner does not have blisters or sores. Latex or polyurethane condoms and female condoms may help prevent the spread of the herpes virus. Managing pain and discomfort If directed, put ice on the painful area. To do this: Put ice in a plastic bag. Place a towel between your skin and the bag. Leave the ice on for 20 minutes, 2-3 times a day. Remove the ice if your skin turns bright red. This is very important. If you cannot feel pain, heat, or  cold, you have a greater risk of damage to the area. If told, take a cool sitz bath to help relieve pain or itching. A sitz bath is a water bath that you take while sitting down in water that is deep enough to cover your hips and buttocks. General instructions Take over-the-counter and  prescription medicines only as told by your health care provider. If you were prescribed an antiviral medicine, use it as told by your health care provider. Do not stop using the antiviral even if you start to feel better. Keep all follow-up visits. This is important. How is this prevented? Use condoms. Although you can get genital herpes during sexual contact even with the use of a condom, a condom can provide some protection. Avoid having multiple sexual partners. Talk with your sexual partner about any symptoms either of you may have. Also, talk with your partner about any history of STIs. Do not have sexual contact if you have active symptoms of genital herpes. Contact a health care provider if: Your symptoms are not improving with medicine. Your symptoms return, or you have new symptoms. You have a fever. You have abdominal pain. You have redness, swelling, or pain in your eye. You notice new sores on other parts of your body. You have had herpes and you become pregnant or plan to become pregnant. Get help right away if: You have symptoms of viral meningitis. This is rare but may happen if the virus spreads to the brain. Symptoms may include: Severe headache or stiff neck. Muscle aches. Nausea and vomiting. Sensitivity to light. Summary Genital herpes is a common sexually transmitted infection (STI) that is caused by the herpes simplex virus, type 1 or type 2 (HSV-1 or HSV-2). These viruses are most often spread through sexual contact with an infected person. You are more likely to develop this condition if you have sex with many partners or you do not use condoms during sex. Most people do not have symptoms or have mild symptoms that may be mistaken for other skin problems. Symptoms occur as outbreaks that may happen months or years apart. There is no cure for this condition, but treatment with oral antiviral medicines can reduce symptoms, reduce the chance of spreading the virus to  a partner, prevent future outbreaks, or shorten future outbreaks. This information is not intended to replace advice given to you by your health care provider. Make sure you discuss any questions you have with your health care provider. Document Revised: 07/09/2021 Document Reviewed: 07/09/2021 [employer] Patient Education  2024 [employer].

## 2024-08-23 ENCOUNTER — Ambulatory Visit: Payer: Self-pay | Admitting: Obstetrics and Gynecology

## 2024-08-23 ENCOUNTER — Encounter: Payer: Self-pay | Admitting: Obstetrics and Gynecology

## 2024-08-23 LAB — HIV ANTIBODY (ROUTINE TESTING W REFLEX)
HIV 1&2 Ab, 4th Generation: NONREACTIVE
HIV FINAL INTERPRETATION: NEGATIVE

## 2024-08-24 LAB — HERPES SIMPLEX VIRUS 1 AND 2 (IGG),REFLEX HSV-2 INHIBITION
HSV 1 IGG,TYPE SPECIFIC AB: <0.9 {index}
HSV 2 IGG,TYPE SPECIFIC AB: 8.41 {index} — ABNORMAL HIGH

## 2024-08-27 ENCOUNTER — Ambulatory Visit: Payer: Self-pay | Admitting: Obstetrics and Gynecology

## 2024-08-29 ENCOUNTER — Other Ambulatory Visit: Payer: Self-pay | Admitting: Obstetrics and Gynecology

## 2024-08-29 ENCOUNTER — Ambulatory Visit: Admitting: Radiology

## 2024-08-29 MED ORDER — VALACYCLOVIR HCL 500 MG PO TABS: 500.0000 mg | ORAL_TABLET | Freq: Every day | ORAL | 12 refills | Status: AC

## 2024-09-27 ENCOUNTER — Encounter: Admitting: Family Medicine
